# Patient Record
Sex: Female | Born: 1980
Health system: Southern US, Community
[De-identification: ages and names within clinical notes are randomized; demographics above are authoritative.]

## PROBLEM LIST (undated history)

## (undated) DIAGNOSIS — E079 Disorder of thyroid, unspecified: Secondary | ICD-10-CM

## (undated) HISTORY — DX: Disorder of thyroid, unspecified: E07.9

## (undated) HISTORY — PX: ECTOPIC PREGNANCY SURGERY: SHX613

---

## 2019-12-29 DIAGNOSIS — Z20822 Contact with and (suspected) exposure to covid-19: Secondary | ICD-10-CM | POA: Diagnosis not present

## 2020-03-01 DIAGNOSIS — H40053 Ocular hypertension, bilateral: Secondary | ICD-10-CM | POA: Diagnosis not present

## 2020-06-17 ENCOUNTER — Encounter: Payer: Self-pay | Admitting: Family Medicine

## 2020-06-17 ENCOUNTER — Ambulatory Visit
Admission: RE | Admit: 2020-06-17 | Discharge: 2020-06-17 | Disposition: A | Discharge status: Home / Self Care | Payer: BC Managed Care – PPO | Source: Ambulatory Visit | Attending: Family Medicine | Admitting: Family Medicine

## 2020-06-17 ENCOUNTER — Other Ambulatory Visit: Payer: Self-pay

## 2020-06-17 ENCOUNTER — Ambulatory Visit
Admission: RE | Admit: 2020-06-17 | Discharge: 2020-06-17 | Disposition: A | Discharge status: Home / Self Care | Payer: BC Managed Care – PPO | Attending: Family Medicine | Admitting: Family Medicine

## 2020-06-17 ENCOUNTER — Ambulatory Visit (INDEPENDENT_AMBULATORY_CARE_PROVIDER_SITE_OTHER): Payer: BC Managed Care – PPO | Admitting: Family Medicine

## 2020-06-17 VITALS — BP 138/82 | HR 72 | Ht 67.0 in | Wt 357.0 lb

## 2020-06-17 DIAGNOSIS — M25562 Pain in left knee: Secondary | ICD-10-CM

## 2020-06-17 DIAGNOSIS — Z124 Encounter for screening for malignant neoplasm of cervix: Secondary | ICD-10-CM

## 2020-06-17 DIAGNOSIS — E038 Other specified hypothyroidism: Secondary | ICD-10-CM

## 2020-06-17 DIAGNOSIS — Z7689 Persons encountering health services in other specified circumstances: Secondary | ICD-10-CM

## 2020-06-17 DIAGNOSIS — M898X6 Other specified disorders of bone, lower leg: Secondary | ICD-10-CM | POA: Diagnosis not present

## 2020-06-17 DIAGNOSIS — M1712 Unilateral primary osteoarthritis, left knee: Secondary | ICD-10-CM | POA: Diagnosis not present

## 2020-06-17 DIAGNOSIS — M25462 Effusion, left knee: Secondary | ICD-10-CM | POA: Diagnosis not present

## 2020-06-17 DIAGNOSIS — E063 Autoimmune thyroiditis: Secondary | ICD-10-CM

## 2020-06-17 MED ORDER — LEVOTHYROXINE SODIUM 150 MCG PO TABS: 150.0000 ug | ORAL_TABLET | Freq: Every day | ORAL | 1 refills | Status: DC

## 2020-06-17 MED ORDER — DICLOFENAC SODIUM 75 MG PO TBEC: 75.0000 mg | DELAYED_RELEASE_TABLET | Freq: Two times a day (BID) | ORAL | 1 refills | Status: DC | PRN

## 2020-06-17 NOTE — Progress Notes (Signed)
Date:  06/17/2020   Name:  Teresa Koch   DOB:  1980/04/09   MRN:  [id number]   Chief Complaint: Establish Care and Hypothyroidism  Patient is a 40 year old female who presents for a establish care exam. The patient reports the following problems: knee pain/ . Health maintenance has been reviewed up to date.  Knee Pain  The incident occurred more than 1 week ago (Feb 11th). The injury mechanism is unknown ("everybody was [employer]"). The pain is present in the left knee. The quality of the pain is described as aching. The pain is moderate. Pertinent negatives include no inability to bear weight, loss of motion, loss of sensation, muscle weakness, numbness or tingling.    No results found for: CREATININE, BUN, NA, K, CL, CO2 No results found for: CHOL, HDL, LDLCALC, LDLDIRECT, TRIG, CHOLHDL No results found for: TSH No results found for: HGBA1C No results found for: WBC, HGB, HCT, MCV, PLT No results found for: ALT, AST, GGT, ALKPHOS, BILITOT   Review of Systems  Constitutional: Negative.  Negative for chills, fatigue, fever and unexpected weight change.  HENT: Negative for congestion, ear discharge, ear pain, rhinorrhea, sinus pressure, sneezing and sore throat.   Eyes: Negative for photophobia, pain, discharge, redness and itching.  Respiratory: Negative for cough, shortness of breath, wheezing and stridor.   Gastrointestinal: Negative for abdominal pain, blood in stool, constipation, diarrhea, nausea and vomiting.  Endocrine: Negative for cold intolerance, heat intolerance, polydipsia, polyphagia and polyuria.  Genitourinary: Negative for dysuria, flank pain, frequency, hematuria, menstrual problem, pelvic pain, urgency, vaginal bleeding and vaginal discharge.  Musculoskeletal: Negative for arthralgias, back pain and myalgias.  Skin: Negative for rash.  Allergic/Immunologic: Negative for environmental allergies and food allergies.  Neurological: Negative for dizziness,  tingling, weakness, light-headedness, numbness and headaches.  Hematological: Negative for adenopathy. Does not bruise/bleed easily.  Psychiatric/Behavioral: Negative for dysphoric mood. The patient is not nervous/anxious.     There are no problems to display for this patient.   Allergies  Allergen Reactions  . Oxycodone-Acetaminophen     Other reaction(s): Other Hallucinations and paranoia  . Other     Other reaction(s): Other (See Comments) Percocet- Hallucinations   . Penicillins Nausea And Vomiting, Other (See Comments) and Diarrhea    Migraines      Past Surgical History:  Procedure Laterality Date  . CESAREAN SECTION     x1  . ECTOPIC PREGNANCY SURGERY      Social History   Tobacco Use  . Smoking status: Former Smoker    Years: 15.00    Types: Cigarettes    Quit date: 03/31/2015    Years since quitting: 5.2  . Smokeless tobacco: Never Used  Substance Use Topics  . Alcohol use: Yes    Comment: once a week  . Drug use: Never     Medication list has been reviewed and updated.  Current Meds  Medication Sig  . levothyroxine (SYNTHROID) 150 MCG tablet Take 1 tablet by mouth daily before breakfast.    PHQ 2/9 Scores 06/17/2020  PHQ - 2 Score 0  PHQ- 9 Score 1    GAD 7 : Generalized Anxiety Score 06/17/2020  Nervous, Anxious, on Edge 0  Control/stop worrying 0  Worry too much - different things 0  Trouble relaxing 0  Restless 0  Easily annoyed or irritable 0  Afraid - awful might happen 0  Total GAD 7 Score 0    BP Readings from Last 3 Encounters:  06/17/20 138/82    Physical Exam Vitals and nursing note reviewed.  Constitutional:      General: She is not in acute distress.    Appearance: She is not diaphoretic.  HENT:     Head: Normocephalic and atraumatic.     Nose: No congestion or rhinorrhea.  Eyes:     General:        Right eye: No discharge.        Left eye: No discharge.     Conjunctiva/sclera: Conjunctivae normal.     Pupils:  Pupils are equal, round, and reactive to light.  Neck:     Thyroid: No thyromegaly.     Vascular: No JVD.  Cardiovascular:     Rate and Rhythm: Normal rate and regular rhythm.     Heart sounds: Normal heart sounds. No murmur heard. No friction rub. No gallop.   Pulmonary:     Effort: Pulmonary effort is normal.     Breath sounds: Normal breath sounds.  Abdominal:     General: Bowel sounds are normal.     Palpations: Abdomen is soft. There is no mass.     Tenderness: There is no abdominal tenderness. There is no guarding.  Musculoskeletal:        General: Normal range of motion.     Cervical back: Normal range of motion and neck supple.     Left knee: Tenderness present over the medial joint line.  Lymphadenopathy:     Cervical: No cervical adenopathy.  Skin:    General: Skin is warm and dry.  Neurological:     Mental Status: She is alert.     Deep Tendon Reflexes: Reflexes are normal and symmetric.     Wt Readings from Last 3 Encounters:  06/17/20 (!) 357 lb (161.9 kg)    BP 138/82   Pulse 72   Ht 5\' 7"  (1.702 m)   Wt (!) 357 lb (161.9 kg)   LMP 06/02/2020 (Approximate)   BMI 55.91 kg/m   Assessment and Plan:  1. Acute pain of left knee New onset.  Persistent.  Patient was doing some activities and woke up the next morning with knee pain approximately 6 weeks ago.  Pain has continued without resolution.  We will ultimately be referring to sports medicine in the meantime we will obtain knee views and will refill diclofenac 75 mg twice a day.  We will ultimately refer to Dr. 08/02/2020 sports medicine for evaluation and treatment. - DG Knee Complete 4 Views Left; Future  2. Hypothyroidism due to Hashimoto's thyroiditis Chronic.  Controlled.  Stable.  Continue levothyroxine 150 mcg daily.  Patient will return to see me for medication review.  3. Cervical cancer screening Patient is referred to GYN for routine cervical cancer screening. - Ambulatory referral to  Gynecology  4. Establishing care with new doctor, encounter for Patient establishing care with new physician.

## 2020-06-19 ENCOUNTER — Other Ambulatory Visit: Payer: Self-pay

## 2020-06-19 DIAGNOSIS — M899 Disorder of bone, unspecified: Secondary | ICD-10-CM

## 2020-06-19 NOTE — Progress Notes (Signed)
MRI of knee placed

## 2020-06-20 ENCOUNTER — Telehealth: Payer: Self-pay

## 2020-06-20 NOTE — Telephone Encounter (Signed)
Called Lowella Bandy- it is under review by insurance- called and left pt a message

## 2020-06-20 NOTE — Telephone Encounter (Signed)
Copied from CRM [id number]. Topic: General - Other >> Jun 20, 2020  2:34 PM Tamela Oddi wrote: Reason for CRM: Patient called ot let Delice Bison know that she still has not been notified regarding her MRI.  She said Delice Bison told her to call if she hadn't been notified by mid day today.  Please call to discuss at [phone]

## 2020-06-28 ENCOUNTER — Encounter: Payer: BC Managed Care – PPO | Admitting: Obstetrics and Gynecology

## 2020-07-03 ENCOUNTER — Other Ambulatory Visit: Payer: BC Managed Care – PPO

## 2020-07-08 ENCOUNTER — Ambulatory Visit
Admission: RE | Admit: 2020-07-08 | Discharge: 2020-07-08 | Disposition: A | Discharge status: Home / Self Care | Payer: BC Managed Care – PPO | Source: Ambulatory Visit | Attending: Family Medicine | Admitting: Family Medicine

## 2020-07-08 ENCOUNTER — Other Ambulatory Visit: Payer: Self-pay

## 2020-07-08 DIAGNOSIS — S83242A Other tear of medial meniscus, current injury, left knee, initial encounter: Secondary | ICD-10-CM | POA: Diagnosis not present

## 2020-07-08 DIAGNOSIS — M899 Disorder of bone, unspecified: Secondary | ICD-10-CM | POA: Insufficient documentation

## 2020-07-08 DIAGNOSIS — M898X6 Other specified disorders of bone, lower leg: Secondary | ICD-10-CM | POA: Diagnosis not present

## 2020-07-09 ENCOUNTER — Encounter: Payer: BC Managed Care – PPO | Admitting: Obstetrics and Gynecology

## 2020-07-09 ENCOUNTER — Other Ambulatory Visit: Payer: Self-pay

## 2020-07-09 ENCOUNTER — Ambulatory Visit: Payer: BC Managed Care – PPO | Admitting: Family Medicine

## 2020-07-09 DIAGNOSIS — S83206A Unspecified tear of unspecified meniscus, current injury, right knee, initial encounter: Secondary | ICD-10-CM

## 2020-07-09 DIAGNOSIS — M949 Disorder of cartilage, unspecified: Secondary | ICD-10-CM

## 2020-07-09 NOTE — Progress Notes (Signed)
Ref to ortho placed

## 2020-07-10 ENCOUNTER — Telehealth: Payer: Self-pay

## 2020-07-10 NOTE — Telephone Encounter (Signed)
Called and spoke with patient. Asked her when her last pap was. She said she has not had one in at least 5 years. Dr Yetta Barre sent a referral for the patient last month for her to see GYN for a physical.  Told patient the importance of calling to schedule a pap and she said she will call and schedule this.

## 2020-07-11 ENCOUNTER — Ambulatory Visit (INDEPENDENT_AMBULATORY_CARE_PROVIDER_SITE_OTHER): Payer: BC Managed Care – PPO | Admitting: Family Medicine

## 2020-07-11 ENCOUNTER — Encounter: Payer: Self-pay | Admitting: Family Medicine

## 2020-07-11 ENCOUNTER — Other Ambulatory Visit: Payer: Self-pay

## 2020-07-11 VITALS — BP 120/84 | HR 80 | Ht 67.0 in | Wt 354.0 lb

## 2020-07-11 DIAGNOSIS — M899 Disorder of bone, unspecified: Secondary | ICD-10-CM | POA: Diagnosis not present

## 2020-07-11 DIAGNOSIS — M25562 Pain in left knee: Secondary | ICD-10-CM | POA: Diagnosis not present

## 2020-07-11 DIAGNOSIS — E038 Other specified hypothyroidism: Secondary | ICD-10-CM | POA: Diagnosis not present

## 2020-07-11 DIAGNOSIS — E063 Autoimmune thyroiditis: Secondary | ICD-10-CM

## 2020-07-11 NOTE — Progress Notes (Signed)
Date:  07/11/2020   Name:  Teresa Koch   DOB:  June 25, 1980   MRN:  [id number]   Chief Complaint: Hypothyroidism  Thyroid Problem Presents for follow-up visit. Patient reports no anxiety, cold intolerance, constipation, depressed mood, diaphoresis, diarrhea, dry skin, fatigue, hair loss, heat intolerance, hoarse voice, leg swelling, menstrual problem, nail problem, palpitations, tremors, visual change, weight gain or weight loss.    No results found for: CREATININE, BUN, NA, K, CL, CO2 No results found for: CHOL, HDL, LDLCALC, LDLDIRECT, TRIG, CHOLHDL No results found for: TSH No results found for: HGBA1C No results found for: WBC, HGB, HCT, MCV, PLT No results found for: ALT, AST, GGT, ALKPHOS, BILITOT   Review of Systems  Constitutional: Negative.  Negative for chills, diaphoresis, fatigue, fever, unexpected weight change, weight gain and weight loss.  HENT: Negative for congestion, ear discharge, ear pain, hoarse voice, rhinorrhea, sinus pressure, sneezing and sore throat.   Eyes: Negative for photophobia, pain, discharge, redness and itching.  Respiratory: Negative for cough, shortness of breath, wheezing and stridor.   Cardiovascular: Negative for chest pain, palpitations and leg swelling.  Gastrointestinal: Negative for abdominal pain, blood in stool, constipation, diarrhea, nausea and vomiting.  Endocrine: Negative for cold intolerance, heat intolerance, polydipsia, polyphagia and polyuria.  Genitourinary: Negative for dysuria, flank pain, frequency, hematuria, menstrual problem, pelvic pain, urgency, vaginal bleeding and vaginal discharge.  Musculoskeletal: Negative for arthralgias, back pain and myalgias.  Skin: Negative for rash.  Allergic/Immunologic: Negative for environmental allergies and food allergies.  Neurological: Negative for dizziness, tremors, weakness, light-headedness, numbness and headaches.  Hematological: Negative for adenopathy. Does not bruise/bleed  easily.  Psychiatric/Behavioral: Negative for dysphoric mood. The patient is not nervous/anxious.     There are no problems to display for this patient.   Allergies  Allergen Reactions  . Oxycodone-Acetaminophen     Other reaction(s): Other Hallucinations and paranoia  . Other     Other reaction(s): Other (See Comments) Percocet- Hallucinations   . Penicillins Nausea And Vomiting, Other (See Comments) and Diarrhea    Migraines      Past Surgical History:  Procedure Laterality Date  . CESAREAN SECTION     x1  . ECTOPIC PREGNANCY SURGERY      Social History   Tobacco Use  . Smoking status: Former Smoker    Years: 15.00    Types: Cigarettes    Quit date: 03/31/2015    Years since quitting: 5.2  . Smokeless tobacco: Never Used  Substance Use Topics  . Alcohol use: Yes    Comment: once a week  . Drug use: Never     Medication list has been reviewed and updated.  Current Meds  Medication Sig  . diclofenac (VOLTAREN) 75 MG EC tablet Take 1 tablet (75 mg total) by mouth 2 (two) times daily as needed.  Marland Kitchen levothyroxine (SYNTHROID) 150 MCG tablet Take 1 tablet (150 mcg total) by mouth daily before breakfast.    PHQ 2/9 Scores 06/17/2020  PHQ - 2 Score 0  PHQ- 9 Score 1    GAD 7 : Generalized Anxiety Score 06/17/2020  Nervous, Anxious, on Edge 0  Control/stop worrying 0  Worry too much - different things 0  Trouble relaxing 0  Restless 0  Easily annoyed or irritable 0  Afraid - awful might happen 0  Total GAD 7 Score 0    BP Readings from Last 3 Encounters:  07/11/20 120/84  06/17/20 138/82    Physical Exam Vitals and nursing  note reviewed.  Constitutional:      Appearance: She is well-developed.  HENT:     Head: Normocephalic.     Right Ear: Tympanic membrane, ear canal and external ear normal.     Left Ear: Tympanic membrane, ear canal and external ear normal.     Nose: Nose normal. No congestion or rhinorrhea.     Mouth/Throat:     Mouth: Mucous  membranes are moist.  Eyes:     General: Lids are everted, no foreign bodies appreciated. No scleral icterus.       Left eye: No foreign body or hordeolum.     Conjunctiva/sclera: Conjunctivae normal.     Right eye: Right conjunctiva is not injected.     Left eye: Left conjunctiva is not injected.     Pupils: Pupils are equal, round, and reactive to light.  Neck:     Thyroid: No thyroid mass, thyromegaly or thyroid tenderness.     Vascular: No carotid bruit or JVD.     Trachea: No tracheal deviation.  Cardiovascular:     Rate and Rhythm: Normal rate and regular rhythm.     Heart sounds: Normal heart sounds. No murmur heard. No friction rub. No gallop.   Pulmonary:     Effort: Pulmonary effort is normal. No respiratory distress.     Breath sounds: Normal breath sounds. No wheezing, rhonchi or rales.  Abdominal:     General: Bowel sounds are normal.     Palpations: Abdomen is soft. There is no hepatomegaly, splenomegaly or mass.     Tenderness: There is no abdominal tenderness. There is no guarding or rebound.  Musculoskeletal:        General: No tenderness. Normal range of motion.     Cervical back: Normal range of motion and neck supple. No rigidity or tenderness.  Lymphadenopathy:     Cervical: No cervical adenopathy.     Right cervical: No superficial or deep cervical adenopathy.    Left cervical: No superficial or deep cervical adenopathy.  Skin:    General: Skin is warm.     Findings: No erythema or rash.  Neurological:     Mental Status: She is alert and oriented to person, place, and time.     Cranial Nerves: No cranial nerve deficit.     Deep Tendon Reflexes: Reflexes normal.  Psychiatric:        Mood and Affect: Mood is not anxious or depressed.     Wt Readings from Last 3 Encounters:  07/11/20 (!) 354 lb (160.6 kg)  06/17/20 (!) 357 lb (161.9 kg)    BP 120/84   Pulse 80   Ht 5\' 7"  (1.702 m)   Wt (!) 354 lb (160.6 kg)   BMI 55.44 kg/m   Assessment and  Plan: 1. Hypothyroidism due to Hashimoto's thyroiditis Chronic.  Controlled.  Stable.  Patient is currently on levothyroxine 150 mcg daily.  We will check a TSH with panel to assess current level of control. - Thyroid Panel With TSH  2. Acute pain of left knee New onset.  Patient is currently on an anti-inflammatory and is improving.  There is a tear that is noted in a portion of the cartilage this is seemingly starting to resolve to the point of patient's acceptance of the level of pain.  This is been addressed with patient and her next step is to have orthopedics involved for possible involvement in the near future.  3. Lesion of bone of knee There is  an incidental finding of a subchondral lesion on MRI.  This will need to be further evaluated either by direct MRI of the area or orthopedic evaluation.  Patient is currently in going to [place]. [place] we are in the process of delineating which direction of further evaluation at this time.  Patient has been informed that if he has not been determined the definite direction of evaluation in 6 weeks that she is still notify us or return for reevaluation.

## 2020-07-11 NOTE — Addendum Note (Signed)
Addended by: Everitt Amber on: 07/11/2020 11:32 AM   Modules accepted: Orders

## 2020-07-12 LAB — THYROID PANEL WITH TSH
Free Thyroxine Index: 2.3 (ref 1.2–4.9)
T3 Uptake Ratio: 29 % (ref 24–39)
T4, Total: 7.9 ug/dL (ref 4.5–12.0)
TSH: 11.1 u[IU]/mL — ABNORMAL HIGH (ref 0.450–4.500)

## 2020-07-15 ENCOUNTER — Other Ambulatory Visit: Payer: Self-pay

## 2020-07-15 DIAGNOSIS — E038 Other specified hypothyroidism: Secondary | ICD-10-CM

## 2020-07-15 DIAGNOSIS — E063 Autoimmune thyroiditis: Secondary | ICD-10-CM

## 2020-07-15 MED ORDER — LEVOTHYROXINE SODIUM 175 MCG PO TABS: 175.0000 ug | ORAL_TABLET | Freq: Every day | ORAL | 1 refills | Status: DC

## 2020-07-24 DIAGNOSIS — M1712 Unilateral primary osteoarthritis, left knee: Secondary | ICD-10-CM | POA: Diagnosis not present

## 2020-07-24 DIAGNOSIS — M899 Disorder of bone, unspecified: Secondary | ICD-10-CM | POA: Diagnosis not present

## 2020-07-24 DIAGNOSIS — M23204 Derangement of unspecified medial meniscus due to old tear or injury, left knee: Secondary | ICD-10-CM | POA: Diagnosis not present

## 2020-07-24 DIAGNOSIS — M25562 Pain in left knee: Secondary | ICD-10-CM | POA: Diagnosis not present

## 2020-07-25 ENCOUNTER — Encounter: Payer: BC Managed Care – PPO | Admitting: Family Medicine

## 2020-07-28 ENCOUNTER — Ambulatory Visit: Payer: BC Managed Care – PPO

## 2020-08-01 ENCOUNTER — Other Ambulatory Visit: Payer: Self-pay

## 2020-08-01 ENCOUNTER — Ambulatory Visit
Admission: RE | Admit: 2020-08-01 | Discharge: 2020-08-01 | Disposition: A | Discharge status: Home / Self Care | Payer: BC Managed Care – PPO | Source: Ambulatory Visit | Attending: Family Medicine | Admitting: Family Medicine

## 2020-08-01 ENCOUNTER — Ambulatory Visit: Payer: BC Managed Care – PPO

## 2020-08-01 DIAGNOSIS — R600 Localized edema: Secondary | ICD-10-CM | POA: Diagnosis not present

## 2020-08-01 DIAGNOSIS — M899 Disorder of bone, unspecified: Secondary | ICD-10-CM | POA: Insufficient documentation

## 2020-08-01 MED ORDER — GADOBUTROL 1 MMOL/ML IV SOLN
10.0000 mL | Freq: Once | INTRAVENOUS | Status: AC | PRN
  Administered 2020-08-01: 10 mL via INTRAVENOUS

## 2020-08-20 ENCOUNTER — Other Ambulatory Visit: Payer: Self-pay | Admitting: Family Medicine

## 2020-08-20 DIAGNOSIS — M25562 Pain in left knee: Secondary | ICD-10-CM

## 2020-08-20 NOTE — Telephone Encounter (Signed)
Requested Prescriptions  Pending Prescriptions Disp Refills  . diclofenac (VOLTAREN) 75 MG EC tablet [Pharmacy Med Name: DICLOFENAC SOD EC 75 MG TAB] 60 tablet 1    Sig: TAKE 1 TABLET BY MOUTH 2 TIMES DAILY AS NEEDED.     Analgesics:  NSAIDS Failed - 08/20/2020  1:41 AM      Failed - Cr in normal range and within 360 days    No results found for: CREATININE, LABCREAU, LABCREA, POCCRE       Failed - HGB in normal range and within 360 days    No results found for: HGB, HGBKUC, HGBPOCKUC, HGBOTHER, TOTHGB, HGBPLASMA       Passed - Patient is not pregnant      Passed - Valid encounter within last 12 months    Recent Outpatient Visits          1 month ago Hypothyroidism due to Hashimoto's thyroiditis   [facility] Duanne Limerick, MD   2 months ago Acute pain of left knee   [facility] Duanne Limerick, MD

## 2020-09-02 ENCOUNTER — Other Ambulatory Visit: Payer: Self-pay | Admitting: Family Medicine

## 2020-09-02 DIAGNOSIS — E038 Other specified hypothyroidism: Secondary | ICD-10-CM

## 2020-10-21 ENCOUNTER — Encounter: Payer: BC Managed Care – PPO | Admitting: Family Medicine

## 2020-10-29 ENCOUNTER — Ambulatory Visit: Payer: BC Managed Care – PPO

## 2020-10-29 ENCOUNTER — Other Ambulatory Visit: Payer: Self-pay

## 2020-10-29 ENCOUNTER — Encounter: Payer: Self-pay | Admitting: Family Medicine

## 2020-10-29 ENCOUNTER — Ambulatory Visit (INDEPENDENT_AMBULATORY_CARE_PROVIDER_SITE_OTHER): Payer: BC Managed Care – PPO | Admitting: Family Medicine

## 2020-10-29 VITALS — BP 122/84 | HR 93 | Temp 98.5°F | Ht 67.0 in | Wt 351.0 lb

## 2020-10-29 DIAGNOSIS — M1712 Unilateral primary osteoarthritis, left knee: Secondary | ICD-10-CM | POA: Diagnosis not present

## 2020-10-29 DIAGNOSIS — M899 Disorder of bone, unspecified: Secondary | ICD-10-CM | POA: Diagnosis not present

## 2020-10-29 DIAGNOSIS — S83241A Other tear of medial meniscus, current injury, right knee, initial encounter: Secondary | ICD-10-CM | POA: Insufficient documentation

## 2020-10-29 DIAGNOSIS — E063 Autoimmune thyroiditis: Secondary | ICD-10-CM

## 2020-10-29 DIAGNOSIS — E038 Other specified hypothyroidism: Secondary | ICD-10-CM

## 2020-10-29 DIAGNOSIS — S83241D Other tear of medial meniscus, current injury, right knee, subsequent encounter: Secondary | ICD-10-CM

## 2020-10-29 NOTE — Assessment & Plan Note (Signed)
Patient without mechanical symptoms, minimally symptomatic on provocative testing, medial meniscus involvement given the extent of osteoarthritis as expected, can consider surgical options if patient fails to adequately progress.

## 2020-10-29 NOTE — Assessment & Plan Note (Signed)
Patient with moderate to severe osteoarthritis related symptoms focal to the patellofemoral and medial tibiofemoral articulations.  Incidentally noted bone lesion at the proximal fibula on her MRI, plan to follow-up imaging scheduled.  Her physical exam findings are consistent with her imaging.  I reviewed nonsurgical strategies for management and we will reach out to obtain authorization for viscosupplementation, additional given the effusion noted today, we will coordinate a time for her to return for possible aspiration and anticipated corticosteroid injection.  Over the interim she is to continue diclofenac on an as-needed basis, maintain gentle activity, and a referral was placed for the start of physical therapy.

## 2020-10-29 NOTE — Assessment & Plan Note (Signed)
Proximal fibula bone lesion incidentally noted on MRI of the left knee, differential per radiologist includes enchondroma versus low-grade chondrosarcoma.  Per chart review, orthopedic oncologist has reviewed images and has recommended repeat imaging in 6-12 months.  This has been ordered by the patient's primary care provider Dr. Yetta Barre, pending results, further formal evaluation to be considered by orthopedic oncologist.

## 2020-10-29 NOTE — Progress Notes (Signed)
Primary Care / Sports Medicine Office Visit  Patient Information:  Patient ID: Teresa Koch, female DOB: 07-25-1980 Age: 40 y.o. MRN: [id number]   Teresa Koch is a pleasant 40 y.o. female presenting with the following:  Chief Complaint  Patient presents with   Knee Pain    Left Knee. This all started in February. Seen Dr Yetta Barre in March and was put on Nsaids. Patient has torn meniscus of right knee per MRI.     Review of Systems pertinent details above   Patient Active Problem List   Diagnosis Date Noted   Primary osteoarthritis of left knee 10/29/2020   Tear of medial meniscus of right knee, current 10/29/2020   Lesion of bone of knee 10/29/2020   Past Medical History:  Diagnosis Date   Thyroid disease    [email or web address]   Outpatient Encounter Medications as of 10/29/2020  Medication Sig   diclofenac (VOLTAREN) 75 MG EC tablet TAKE 1 TABLET BY MOUTH 2 TIMES DAILY AS NEEDED.   levothyroxine (SYNTHROID) 175 MCG tablet TAKE 1 TABLET BY MOUTH DAILY BEFORE BREAKFAST.   No facility-administered encounter medications on file as of 10/29/2020.   Past Surgical History:  Procedure Laterality Date   CESAREAN SECTION     x1   ECTOPIC PREGNANCY SURGERY      Vitals:   10/29/20 0930  BP: 122/84  Pulse: 93  Temp: 98.5 F (36.9 C)  SpO2: 95%   Vitals:   10/29/20 0930  Weight: (!) 351 lb (159.2 kg)  Height: 5\' 7"  (1.702 m)   Body mass index is 54.97 kg/m.  No results found.   Independent interpretation of notes and tests performed by another provider:   Independent interpretation of left knee x-ray dated 06/17/2020 reveals moderate to severe medial tibiofemoral osteoarthritis, subtle angulation of the proximal fibula with sclerosis, patellofemoral tilt and lateral osteophyte formation and narrowing noted, no acute osseous process identified  Procedures performed:   None  Pertinent History, Exam, Impression, and Recommendations:   Primary osteoarthritis of left  knee Patient with moderate to severe osteoarthritis related symptoms focal to the patellofemoral and medial tibiofemoral articulations.  Incidentally noted bone lesion at the proximal fibula on her MRI, plan to follow-up imaging scheduled.  Her physical exam findings are consistent with her imaging.  I reviewed nonsurgical strategies for management and we will reach out to obtain authorization for viscosupplementation, additional given the effusion noted today, we will coordinate a time for her to return for possible aspiration and anticipated corticosteroid injection.  Over the interim she is to continue diclofenac on an as-needed basis, maintain gentle activity, and a referral was placed for the start of physical therapy.  Tear of medial meniscus of right knee, current Patient without mechanical symptoms, minimally symptomatic on provocative testing, medial meniscus involvement given the extent of osteoarthritis as expected, can consider surgical options if patient fails to adequately progress.  Lesion of bone of knee Proximal fibula bone lesion incidentally noted on MRI of the left knee, differential per radiologist includes enchondroma versus low-grade chondrosarcoma.  Per chart review, orthopedic oncologist has reviewed images and has recommended repeat imaging in 6-12 months.  This has been ordered by the patient's primary care provider Dr. 8-12, pending results, further formal evaluation to be considered by orthopedic oncologist.    Orders & Medications No orders of the defined types were placed in this encounter.  Orders Placed This Encounter  Procedures   Ambulatory referral to Physical Therapy  Return We will contact for scheduling.     Jerrol Banana, MD   Primary Care Sports Medicine [facility]

## 2020-10-29 NOTE — Patient Instructions (Signed)
-   Continue diclofenac on an as-needed basis - Utilize ice at 20-minute intervals for additional pain control - Start physical therapy, referral coordinator will contact you in regards to scheduling - Our office will contact you for follow-up after we reach out for viscosupplementation "gel injection" - Continue with activity as tolerated using knee symptoms as a guide - Contact our office for any questions between now and follow-up

## 2020-10-30 ENCOUNTER — Telehealth: Payer: Self-pay | Admitting: Family Medicine

## 2020-10-30 ENCOUNTER — Other Ambulatory Visit: Payer: Self-pay

## 2020-10-30 DIAGNOSIS — E038 Other specified hypothyroidism: Secondary | ICD-10-CM

## 2020-10-30 LAB — THYROID PANEL WITH TSH
Free Thyroxine Index: 3.2 (ref 1.2–4.9)
T3 Uptake Ratio: 30 % (ref 24–39)
T4, Total: 10.5 ug/dL (ref 4.5–12.0)
TSH: 1.91 u[IU]/mL (ref 0.450–4.500)

## 2020-10-30 MED ORDER — LEVOTHYROXINE SODIUM 175 MCG PO TABS: 175.0000 ug | ORAL_TABLET | Freq: Every day | ORAL | 0 refills | Status: DC

## 2020-10-30 NOTE — Telephone Encounter (Signed)
Sent in #[phone] with 0 Refills.

## 2020-10-30 NOTE — Telephone Encounter (Signed)
Pt returned Taras call and also wanted to have her thyroid medication sent into pharmacy as a 90 day supply due to insurance not covering 30 / please advise

## 2020-10-31 ENCOUNTER — Other Ambulatory Visit: Payer: Self-pay | Admitting: Family Medicine

## 2020-10-31 DIAGNOSIS — M25562 Pain in left knee: Secondary | ICD-10-CM

## 2020-11-05 NOTE — Progress Notes (Signed)
New case submitted for left knee viscosupplementation through the [employer] portal. Pending response.

## 2020-11-07 ENCOUNTER — Other Ambulatory Visit: Payer: Self-pay

## 2020-11-07 ENCOUNTER — Ambulatory Visit: Payer: BC Managed Care – PPO | Attending: Family Medicine | Admitting: Physical Therapy

## 2020-11-07 ENCOUNTER — Encounter: Payer: Self-pay | Admitting: Physical Therapy

## 2020-11-07 DIAGNOSIS — R2689 Other abnormalities of gait and mobility: Secondary | ICD-10-CM | POA: Diagnosis not present

## 2020-11-07 DIAGNOSIS — M25562 Pain in left knee: Secondary | ICD-10-CM | POA: Insufficient documentation

## 2020-11-07 DIAGNOSIS — M25662 Stiffness of left knee, not elsewhere classified: Secondary | ICD-10-CM | POA: Insufficient documentation

## 2020-11-07 DIAGNOSIS — R2681 Unsteadiness on feet: Secondary | ICD-10-CM | POA: Diagnosis not present

## 2020-11-07 DIAGNOSIS — G8929 Other chronic pain: Secondary | ICD-10-CM | POA: Insufficient documentation

## 2020-11-07 NOTE — Therapy (Signed)
[facility] [address] [place], [place], [address] Phone: [phone]   Fax:  [phone]  Physical Therapy Treatment and Initial Eval 11/07/20  Patient Details  Name: Teresa FettersRyann Tardiff MRN: [id number] Date of Birth: 1980/12/29 Referring Provider (PT): Joseph BerkshireJason Matthews MD  Encounter Date: 11/07/2020   PT End of Session - 11/07/20 1421     Visit Number 1    Number of Visits 12    Date for PT Re-Evaluation 12/19/20    Authorization - Visit Number 1    Authorization - Number of Visits 10    Progress Note Due on Visit 10    PT Start Time 1259    PT Stop Time 1348    PT Time Calculation (min) 49 min    Activity Tolerance Patient tolerated treatment well;No increased pain    Behavior During Therapy WFL for tasks assessed/performed             Past Medical History:  Diagnosis Date   Thyroid disease    hashimotos    Past Surgical History:  Procedure Laterality Date   CESAREAN SECTION     x1   ECTOPIC PREGNANCY SURGERY      There were no vitals filed for this visit.   Subjective Assessment - 11/07/20 1400     Subjective Pt reported the start of the pain was in the morning after playing with her kids the night before. Onset of pain was in mid February 2022. Pt had a virtual visit shortly after where they suggested diclofenac. Pt later got a x-ray and MRI displaying L knee OA, medial mensicus tear, and abnormal bone density of the fibula. Pt states increased difficulty walking down stairs compared to ascending.  Pt stated that her knee pain has improved from initial onset all but still is not 100%.    Pertinent History x-ray dated 06/17/2020 reveals moderate to severe medial tibiofemoral osteoarthritis, subtle angulation of the proximal fibula. MRI 08/02/2020: Medial meniscus of L knee, Lesion of L Promixal fibula, L knee OA. Pt works from home and is required to travel often for her job 2x months. Her kids are into sports and enjoys  going to the games and shopping. Currently not physical activity.    Limitations Sitting;House hold activities    How long can you sit comfortably? 2 hours    How long can you stand comfortably? WFL    How long can you walk comfortably? WFL    Diagnostic tests x-ray and MRI: OA, Subtle angulation of proximal medial meniscus tear of L knee    Patient Stated Goals Management of pain, and amb up and down stairs in the home without pain and UE assist.    Currently in Pain? Yes    Pain Score 2     Pain Location Knee    Pain Orientation Left    Pain Descriptors / Indicators Aching    Pain Type Chronic pain    Pain Radiating Towards n/a    Pain Onset More than a month ago    Pain Frequency Occasional    Aggravating Factors  hurts more at rest then when movement. Laying down is the most painful position. First few step after prolonged sitting bouts.    Pain Relieving Factors Movement decrease pain, CBD cream, occ Advil    Effect of Pain on Daily Activities Stairs in the home, prolong sitting during work related tasks    Multiple Pain Sites No  SUBJECTIVE Chief complaint:  Pt reported the start of the pain was in the morning after playing with her kids the night before. Onset of pain was in mid February 2022. Pt had a virtual visit shortly after where they suggestion diclofenac. Pain later got a x-ray and MRI displaying L knee OA, medial mensicus tear, and abnormal bone density of the fibular. Pt states increased difficulty walking down stairs compared to up. Pt stated that her knee pain as improved from initial onset all but still is not 100%.  History: x-ray dated 06/17/2020 reveals moderate to severe medial tibiofemoral osteoarthritis, subtle angulation of the proximal fibula. MRI 08/02/2020: Medial meniscus of L knee, Lesion of L Promixal fibula, L knee OA. Pt works from home and is required to travel often for her job 2x months. Her kids are into sports and enjoys going to the  games and shopping. Currently not physical activity. Referring Dx: Primary OA of L knee with medial meniscus tear Referring Provider: Joseph Berkshire MD  Pain location: Medial knee with fatigue at lateral lower leg.  Pain: Present 2/10, Best 0/10, Worst 5/10: Pain quality: Twingy when mild, ache,  Radiating pain: N/A Numbness/Tingling: no  24 hour pain behavior: Increase pain in the morning with first few steps. Mid day: pt has mild pain and is able to walk up and down stairs. Evening the ache returns.  Aggravating factors: hurts more at rest then when movement. Laying down is the most painful position. First few step after prolonged sitting bouts.  Easing factors:Movement decrease pain, CBD cream, occ Advil  How long can you sit: 2 hours How long can you stand: WFL How long can you walk: [facility] History of back injury, pain, surgery, or therapy: None Follow-up appointment with MD: Waiting for a call back.  Dominant hand: Left Imaging*per referring note*: X-ray: Independent interpretation of left knee x-ray dated 06/17/2020 reveals moderate to severe medial tibiofemoral osteoarthritis, subtle angulation of the proximal fibula with sclerosis, patellofemoral tilt and lateral osteophyte formation and narrowing noted, no acute osseous process identified MRI Tear of medial meniscus of left  knee, current Patient without mechanical symptoms, minimally symptomatic on provocative testing, medial meniscus involvement given the extent of osteoarthritis as expected, can consider surgical options if patient fails to adequately progress.   Lesion of bone of knee Proximal fibula bone lesion incidentally noted on MRI of the left knee, differential per radiologist includes enchondroma versus low-grade chondrosarcoma.  Per chart review, orthopedic oncologist has reviewed images and has recommended repeat imaging in 6-12 months.  This has been ordered by the patient's primary care provider Dr. Yetta Barre, pending results,  further formal evaluation to be considered by orthopedic oncologist. Falls in the last 6 months:  Occupational demands: Pt works from home and is required to travel often for her job 2x months. Hobbies: Her kids are into sports and enjoys going to the games and shopping.  Goals: Management of pain, and amb up and down stairs in the home without pain and UE assist.  Red flags (bowel/bladder changes, saddle paresthesia, personal history of cancer, chills/fever, night sweats, unrelenting pain, first onset of insidious LBP <20 y/o) Negative    OBJECTIVE  FOTO: Score 52 with predicted improvement value of 69  Mental Status Patient is oriented to person, place and time.  Recent memory is intact.  Remote memory is intact.  Attention span and concentration are intact.  Expressive speech is intact.  Patient's level of knowledge is within normal limits for educational level.  SENSATION: Grossly intact to light touch bilateral LEs as determined by testing dermatomes L2-S2    MUSCULOSKELETAL: Tremor: None Bulk: Normal Tone: Normal   Posture Seated standing posture with WNL with slight weight to the RLE. Able to correct with verbal cueing.   Gait Mild antalgic gait with LLE involvement at the start of tx. Antalgic gait was eliminated after LE screen.  Stairs: bilat ER, reduced knee flexion during eccentric loading, requires UE assist.    Palpation -TTP medial L knee joint line and 3 cm below, L patella tendon, popliteal fossa.  -R knee no tenderness.   Strength (out of 5) R/L 5/5 Hip flexion 5/5 Hip abduction 5/5 Hip adduction 5/5 Knee extension 5/5 Knee flexion 5/5 Ankle dorsiflexion   *Indicates pain   AROM (degrees) Hip Flexion (0-125): WNL Hip Abduction (0-40): deferred  Knee Flexion (0-135): R:100 L:100 Knee Extension (0): R:0 L: 5 Ankle DF (0-35) WFL Ankle PL (0-50) WFL   PROM (degrees)  Knee Flexion (0-135): R: same as AROM L: same as AROM Knee Extension (0):  R:Same as AROM L: Same as AROM- limited by pain.  Ankle DF (0-35) same as AROM Ankle PL (0-50) same as AROM  Repeated Movements No warranted at initial eval    Muscle Length Hamstrings: R: WNL degrees L: lacking 20 degrees  Thomas: deferred to later tx 2/2 time       SPECIAL TESTS  Hip: FABER (SN 81): NEG bilat  FADIR (SN 94): NEG bilat  Hip scour (SN 50): NEG bilat   Knee:  Clarke's test: postive L neg R  Steinman test Neg bilat  Ant draw Neg bilat  Post Draw Neg bilat  Valgus stressNeg bilat  Varus stressNeg bilat     Functional Tasks   Squatting: Partial squat to post hip touch on table x10. Pt displayed minor weight shift to R. Able to correct with verbal cueing. No increase in pain  Sit to stand: WNL  Single leg stance: R:13 sec L: 9 sec. Pt display more difficulty with SLS on left compared to right with more aberrant movement.     There.ex.:  See HEP ([id number])     [facility] PT Assessment - 11/07/20 0001       Assessment   Medical Diagnosis Primary OA of L knee with medial meniscus tear    Referring Provider (PT) Joseph Berkshire MD    Onset Date/Surgical Date 05/15/20    Hand Dominance Left    Next MD Visit Waiting for call back    Prior Therapy No      Precautions   Precautions None      Restrictions   Weight Bearing Restrictions No      Balance Screen   Has the patient fallen in the past 6 months No      Home Environment   Living Environment Private residence      Prior Function   Level of Independence Independent    Vocation Full time employment    Vocation Requirements works at home with 2x month travel for work    Leisure watching kids play sports, shoping, reading.                  PT Education - 11/07/20 1420     Education Details Pt was educate on proper form and technique for HEP for intial adherence.    Person(s) Educated Patient    Methods Explanation;Handout;Demonstration;Verbal cues    Comprehension Verbalized  understanding  PT Long Term Goals - 11/07/20 1442       PT LONG TERM GOAL #1   Title Pt will decrease L knee pain as reported on NPRS by at least 2 points in order to demonstrate clinically significant reduction in back pain.    Baseline 5/10 at worst    Time 6    Period Weeks    Status New    Target Date 12/19/20      PT LONG TERM GOAL #2   Title Pt will improve foto score to 69 to display reported return to optimal funtion and independence with home and community related ADLs    Baseline 52/56 on initial evaluation    Time 6    Period Weeks    Status New    Target Date 12/19/20      PT LONG TERM GOAL #3   Title Pt will improve L knee ext ROM in supine and hamstring mobilty equal to R side with pain less than 2/10 to promote a normalized gait pattern.    Baseline supine ROM lacking 5 deg. L hamstring deficits lacking 20 deg compared to R    Time 6    Period Weeks    Status New    Target Date 12/19/20      PT LONG TERM GOAL #4   Title Pt will improve SLS to 30 second bilat without increase in pain and UE assit to promote greater bilat knee stability for decreased risk of further injury.    Baseline R:13 sec L: 9 sec. Pt display more difficulty with SLS on left compared to right with more aberrant movement.    Time 6    Period Weeks    Status New    Target Date 12/19/20                   Plan - 11/07/20 1422     Clinical Impression Statement Pt is a pleasant 40 year-old female referred for primary L knee OA with medial meniscus involvement. PT examination reveals the following deficits: FOTO score of 52 with predicted improvement of 69. Worse pain of 5/10. L knee ext(lacking 5) and hamstring mobility (lacking 20) deficits compared to the other side. Poor bilat SLS balance(13 sec R, 9 Sec L). Pt displayed 5/5 strength with bilat seated muscle testing. Pt's case displays a moderate complexity due to time from onset, BMI, current physical activity  status. Pt will benefit from skilled PT 2x weeks for 6 weeks with a good prognosis for return to PLOF.    Personal Factors and Comorbidities Time since onset of injury/illness/exacerbation;Fitness;Profession    Examination-Activity Limitations Sit;Sleep;Bend;Lift;Squat;Stairs    Examination-Participation Restrictions Occupation;Shop    Stability/Clinical Decision Making Evolving/Moderate complexity    Clinical Decision Making Moderate    Rehab Potential Good    PT Frequency 2x / week    PT Duration 6 weeks    PT Treatment/Interventions ADLs/Self Care Home Management;Aquatic Therapy;Biofeedback;Cryotherapy;Electrical Stimulation;Moist Heat;Traction;Gait training;Stair training;Functional mobility training;Therapeutic activities;Neuromuscular re-education;Balance training;Therapeutic exercise;Patient/family education;Manual techniques;Passive range of motion;Dry needling;Spinal Manipulations;Joint Manipulations;Taping    PT Next Visit Plan Reassess soreness and HEP adherence.    PT Home Exercise Plan [email or web address]    Recommended Other Services Not at this time.             Patient will benefit from skilled therapeutic intervention in order to improve the following deficits and impairments:  Abnormal gait, Decreased activity tolerance, Decreased balance, Decreased endurance, Decreased mobility, Decreased range of motion, Difficulty walking,  Hypomobility, Increased edema, Impaired flexibility, Pain, Improper body mechanics, Obesity  Visit Diagnosis: Chronic pain of left knee  Generally unsteady  Antalgic gait  Decreased ROM of left knee     Problem List Patient Active Problem List   Diagnosis Date Noted   Primary osteoarthritis of left knee 10/29/2020   Tear of medial meniscus of right knee, current 10/29/2020   Lesion of bone of knee 10/29/2020   Cammie Mcgee, PT, DPT # [id number] Lawernce Ion SPT 11/07/2020, 6:39 PM  [facility] [address] [place], [place], [address] Phone: [phone]   Fax:  [phone]  Name: Lincoln Kleiner MRN: [id number] Date of Birth: [birth year]

## 2020-11-12 ENCOUNTER — Other Ambulatory Visit: Payer: Self-pay

## 2020-11-12 ENCOUNTER — Ambulatory Visit: Payer: BC Managed Care – PPO | Admitting: Physical Therapy

## 2020-11-12 ENCOUNTER — Encounter: Payer: Self-pay | Admitting: Physical Therapy

## 2020-11-12 DIAGNOSIS — R2689 Other abnormalities of gait and mobility: Secondary | ICD-10-CM

## 2020-11-12 DIAGNOSIS — M25662 Stiffness of left knee, not elsewhere classified: Secondary | ICD-10-CM | POA: Diagnosis not present

## 2020-11-12 DIAGNOSIS — R2681 Unsteadiness on feet: Secondary | ICD-10-CM

## 2020-11-12 DIAGNOSIS — M25562 Pain in left knee: Secondary | ICD-10-CM | POA: Diagnosis not present

## 2020-11-12 DIAGNOSIS — G8929 Other chronic pain: Secondary | ICD-10-CM | POA: Diagnosis not present

## 2020-11-12 NOTE — Therapy (Signed)
[facility] [address] [place], [place], [address] Phone: [phone]   Fax:  [phone]  Physical Therapy Treatment  Patient Details  Name: Teresa Koch MRN: [id number] Date of Birth: 1980-12-30 Referring Provider (PT): Joseph Berkshire MD   Encounter Date: 11/12/2020   PT End of Session - 11/12/20 1307     Visit Number 2    Number of Visits 12    Date for PT Re-Evaluation 12/19/20    Authorization - Visit Number 2    Authorization - Number of Visits 10    Progress Note Due on Visit 10    PT Start Time 1304    PT Stop Time 1349    PT Time Calculation (min) 45 min    Activity Tolerance Patient tolerated treatment well;No increased pain    Behavior During Therapy WFL for tasks assessed/performed             Past Medical History:  Diagnosis Date   Thyroid disease    hashimotos    Past Surgical History:  Procedure Laterality Date   CESAREAN SECTION     x1   ECTOPIC PREGNANCY SURGERY      There were no vitals filed for this visit.   Subjective Assessment - 11/12/20 1303     Subjective Pt present to tx with 1-2/10 L knee pain. Pt states good adherence to HEP without adverse soreness. Pt stated no increase in knee pain over the weekend.    Pertinent History x-ray dated 06/17/2020 reveals moderate to severe medial tibiofemoral osteoarthritis, subtle angulation of the proximal fibula. MRI 08/02/2020: Medial meniscus of L knee, Lesion of L Promixal fibula, L knee OA. Pt works from home and is required to travel often for her job 2x months. Her kids are into sports and enjoys going to the games and shopping. Currently not physical activity.    Limitations Sitting;House hold activities    How long can you sit comfortably? 2 hours    How long can you stand comfortably? WFL    How long can you walk comfortably? WFL    Diagnostic tests x-ray and MRI: OA, Subtle angulation of proximal medial meniscus tear of L knee    Patient  Stated Goals Management of pain, and amb up and down stairs in the home without pain and UE assist.    Currently in Pain? Yes    Pain Score 2     Pain Location Knee    Pain Orientation Left    Pain Descriptors / Indicators Aching    Pain Type Chronic pain    Pain Onset More than a month ago              Treatment   Therapeutic Exercise:  NuStep on position 11, level 4 without UE assist. Pt reported no increase knee pain  // Bars with touch assist and occ verbal cueing to proper form and function.  1) standing abd x30 each direction  2) partial walking lunges x3 laps  3) Walking hamstring curls x 3 laps    Stair climbing x3 with bilat UE assist, 3x with only handrail on L, and x3 without UE assist. Pt reports more pain during decent compared to ascent.     Airex stability with 1 min in each position with verbal cueing for core activation. 1) standard stance width  2) feet together  3)tandem x2  4) SLS x2   Pt required occ touch assist during tandem and SLS for  both LE.    STS without UE from plinth 3x10 with lowering of the table after each set with the 3rd set on the lowest level. Verbal cueing for proper LE alignment and loading.    Manual Therapy:  IASTM with hypervolt to distal L quad and light pressure over patella tendon and medial knee. X11 mins     PT Education - 11/12/20 1306     Education Details Pt was educate on proper form and tecnique during therex.    Person(s) Educated Patient    Methods Explanation;Demonstration;Verbal cues    Comprehension Verbalized understanding;Need further instruction                 PT Long Term Goals - 11/07/20 1442       PT LONG TERM GOAL #1   Title Pt will decrease L knee pain as reported on NPRS by at least 2 points in order to demonstrate clinically significant reduction in back pain.    Baseline 5/10 at worst    Time 6    Period Weeks    Status New    Target Date 12/19/20      PT LONG TERM GOAL #2    Title Pt will improve foto score to 69 to display reported return to optimal funtion and independence with home and community related ADLs    Baseline 52/56 on initial evaluation    Time 6    Period Weeks    Status New    Target Date 12/19/20      PT LONG TERM GOAL #3   Title Pt will improve L knee ext ROM in supine and hamstring mobilty equal to R side with pain less than 2/10 to promote a normalized gait pattern.    Baseline supine ROM lacking 5 deg. L hamstring deficits lacking 20 deg compared to R    Time 6    Period Weeks    Status New    Target Date 12/19/20      PT LONG TERM GOAL #4   Title Pt will improve SLS to 30 second bilat without increase in pain and UE assit to promote greater bilat knee stability for decreased risk of further injury.    Baseline R:13 sec L: 9 sec. Pt display more difficulty with SLS on left compared to right with more aberrant movement.    Time 6    Period Weeks    Status New    Target Date 12/19/20                   Plan - 11/12/20 1308     Clinical Impression Statement Pt tolerated 2nd treatment very well without increase in L knee pain. Pt stated mod fatigue of bilat quads at the end of tx. Pt displayed good mechanics with STS and could be progression to squats soon. Pt continues to display low levels of pain during all mobility and mild L antalgic gait pattern. Pt. will continue to benefit from skilled physical therapy to progress POC to address remaining deficits to facilitate maximum functional capacity for optimal personal health and wellness for ADLs.    Personal Factors and Comorbidities Time since onset of injury/illness/exacerbation;Fitness;Profession    Examination-Activity Limitations Sit;Sleep;Bend;Lift;Squat;Stairs    Examination-Participation Restrictions Occupation;Shop    Stability/Clinical Decision Making Evolving/Moderate complexity    Clinical Decision Making Moderate    Rehab Potential Good    PT Frequency 2x / week     PT Duration 6 weeks    PT  Treatment/Interventions ADLs/Self Care Home Management;Aquatic Therapy;Biofeedback;Cryotherapy;Electrical Stimulation;Moist Heat;Traction;Gait training;Stair training;Functional mobility training;Therapeutic activities;Neuromuscular re-education;Balance training;Therapeutic exercise;Patient/family education;Manual techniques;Passive range of motion;Dry needling;Spinal Manipulations;Joint Manipulations;Taping    PT Next Visit Plan Reassess soreness and HEP adherence.    PT Home Exercise Plan [id number]             Patient will benefit from skilled therapeutic intervention in order to improve the following deficits and impairments:  Abnormal gait, Decreased activity tolerance, Decreased balance, Decreased endurance, Decreased mobility, Decreased range of motion, Difficulty walking, Hypomobility, Increased edema, Impaired flexibility, Pain, Improper body mechanics, Obesity  Visit Diagnosis: Chronic pain of left knee  Generally unsteady  Antalgic gait  Decreased ROM of left knee     Problem List Patient Active Problem List   Diagnosis Date Noted   Primary osteoarthritis of left knee 10/29/2020   Tear of medial meniscus of right knee, current 10/29/2020   Lesion of bone of knee 10/29/2020   Cammie Mcgee, PT, DPT # [id number] Lawernce Ion SPT 11/12/2020, 2:58 PM  [facility] [address] [place], [place], [address] Phone: [phone]   Fax:  [phone]  Name: Turner Kunzman MRN: [id number] Date of Birth: 22-Jan-1981

## 2020-11-14 ENCOUNTER — Other Ambulatory Visit: Payer: Self-pay

## 2020-11-14 ENCOUNTER — Encounter: Payer: Self-pay | Admitting: Physical Therapy

## 2020-11-14 ENCOUNTER — Ambulatory Visit: Payer: BC Managed Care – PPO | Admitting: Physical Therapy

## 2020-11-14 DIAGNOSIS — R2681 Unsteadiness on feet: Secondary | ICD-10-CM | POA: Diagnosis not present

## 2020-11-14 DIAGNOSIS — G8929 Other chronic pain: Secondary | ICD-10-CM

## 2020-11-14 DIAGNOSIS — M25662 Stiffness of left knee, not elsewhere classified: Secondary | ICD-10-CM | POA: Diagnosis not present

## 2020-11-14 DIAGNOSIS — R2689 Other abnormalities of gait and mobility: Secondary | ICD-10-CM

## 2020-11-14 DIAGNOSIS — M25562 Pain in left knee: Secondary | ICD-10-CM | POA: Diagnosis not present

## 2020-11-14 NOTE — Therapy (Signed)
[facility] [address] [place], [place], [address] Phone: [phone]   Fax:  [phone]  Physical Therapy Treatment  Patient Details  Name: Teresa Koch MRN: [id number] Date of Birth: [birth year] Referring Provider (PT): Joseph Berkshire MD   Encounter Date: 11/14/2020   PT End of Session - 11/14/20 1310     Visit Number 3    Number of Visits 12    Date for PT Re-Evaluation 12/19/20    Authorization - Visit Number 3    Authorization - Number of Visits 10    Progress Note Due on Visit 10    PT Start Time 1306    PT Stop Time 1347    PT Time Calculation (min) 41 min    Activity Tolerance Patient tolerated treatment well;No increased pain    Behavior During Therapy WFL for tasks assessed/performed             Past Medical History:  Diagnosis Date   Thyroid disease    hashimotos    Past Surgical History:  Procedure Laterality Date   CESAREAN SECTION     x1   ECTOPIC PREGNANCY SURGERY      There were no vitals filed for this visit.   Subjective Assessment - 11/14/20 1308     Subjective Pt present to tx with 1/10 L knee pain. Pt states that she had mod soreness after last tx that reduced to baseline within 24 hours.    Pertinent History x-ray dated 06/17/2020 reveals moderate to severe medial tibiofemoral osteoarthritis, subtle angulation of the proximal fibula. MRI 08/02/2020: Medial meniscus of L knee, Lesion of L Promixal fibula, L knee OA. Pt works from home and is required to travel often for her job 2x months. Her kids are into sports and enjoys going to the games and shopping. Currently not physical activity.    Limitations Sitting;House hold activities    How long can you sit comfortably? 2 hours    How long can you stand comfortably? WFL    How long can you walk comfortably? WFL    Diagnostic tests x-ray and MRI: OA, Subtle angulation of proximal medial meniscus tear of L knee    Patient Stated Goals  Management of pain, and amb up and down stairs in the home without pain and UE assist.    Currently in Pain? Yes    Pain Score 1     Pain Location Knee    Pain Orientation Left    Pain Descriptors / Indicators Aching    Pain Type Chronic pain    Pain Onset More than a month ago    Pain Frequency Occasional             Treatment    Therapeutic Exercise:   NuStep on position 10, level 4 without UE assist. Pt reported no increase knee pain   // Bars with touch assist and occ verbal cueing to proper form and function.  1) Lateral band walks with RTB 5 laps   2) partial walking lunges x3 laps  3) Walking hamstring curls x 3 laps with 4# ankle weights bilat.        Airex stability with 1 min in each position with verbal cueing for core activation. 1) standard stance width  2) feet together  3)tandem x2  4) SLS x2    Pt required occ touch assist during tandem and SLS for both LE.         Manual Therapy:  IASTM with hypervolt to distal L quad and light pressure over patella tendon and medial knee. X11 mins              PT Long Term Goals - 11/07/20 1442       PT LONG TERM GOAL #1   Title Pt will decrease L knee pain as reported on NPRS by at least 2 points in order to demonstrate clinically significant reduction in back pain.    Baseline 5/10 at worst    Time 6    Period Weeks    Status New    Target Date 12/19/20      PT LONG TERM GOAL #2   Title Pt will improve foto score to 69 to display reported return to optimal funtion and independence with home and community related ADLs    Baseline 52/56 on initial evaluation    Time 6    Period Weeks    Status New    Target Date 12/19/20      PT LONG TERM GOAL #3   Title Pt will improve L knee ext ROM in supine and hamstring mobilty equal to R side with pain less than 2/10 to promote a normalized gait pattern.    Baseline supine ROM lacking 5 deg. L hamstring deficits lacking 20 deg compared to R    Time 6     Period Weeks    Status New    Target Date 12/19/20      PT LONG TERM GOAL #4   Title Pt will improve SLS to 30 second bilat without increase in pain and UE assit to promote greater bilat knee stability for decreased risk of further injury.    Baseline R:13 sec L: 9 sec. Pt display more difficulty with SLS on left compared to right with more aberrant movement.    Time 6    Period Weeks    Status New    Target Date 12/19/20                   Plan - 11/14/20 1310     Clinical Impression Statement Pt tolerated tx well with progression of // bars therex. Pt stated bilat quad fatigue at the end of tx but no increase in pain. Pt stated reduction in L knee pain to 0/10 and displayed no antalgic gait post manual intervention. Pt continues to display LE fatigue with exercise that leads to increased risk of re-injury. Pt. will continue to benefit from skilled physical therapy to progress POC to address remaining deficits to facilitate maximum functional capacity for optimal personal health and wellness for ADLs.    Personal Factors and Comorbidities Time since onset of injury/illness/exacerbation;Fitness;Profession    Examination-Activity Limitations Sit;Sleep;Bend;Lift;Squat;Stairs    Examination-Participation Restrictions Occupation;Shop    Stability/Clinical Decision Making Evolving/Moderate complexity    Clinical Decision Making Moderate    Rehab Potential Good    PT Frequency 2x / week    PT Duration 6 weeks    PT Treatment/Interventions ADLs/Self Care Home Management;Aquatic Therapy;Biofeedback;Cryotherapy;Electrical Stimulation;Moist Heat;Traction;Gait training;Stair training;Functional mobility training;Therapeutic activities;Neuromuscular re-education;Balance training;Therapeutic exercise;Patient/family education;Manual techniques;Passive range of motion;Dry needling;Spinal Manipulations;Joint Manipulations;Taping    PT Next Visit Plan Reassess soreness and HEP adherence.  Possible update in HEP.    PT Home Exercise Plan [email or web address]             Patient will benefit from skilled therapeutic intervention in order to improve the following deficits and impairments:  Abnormal gait, Decreased activity tolerance, Decreased balance, Decreased endurance,  Decreased mobility, Decreased range of motion, Difficulty walking, Hypomobility, Increased edema, Impaired flexibility, Pain, Improper body mechanics, Obesity  Visit Diagnosis: Chronic pain of left knee  Generally unsteady  Antalgic gait  Decreased ROM of left knee     Problem List Patient Active Problem List   Diagnosis Date Noted   Primary osteoarthritis of left knee 10/29/2020   Tear of medial meniscus of right knee, current 10/29/2020   Lesion of bone of knee 10/29/2020   Cammie Mcgee, PT, DPT # [id number] Lawernce Ion, SPT 11/15/2020, 10:45 AM  [facility] [address] [place], [place], [address] Phone: [phone]   Fax:  [phone]  Name: Teresa Koch MRN: [id number] Date of Birth: 1980/10/25

## 2020-11-19 ENCOUNTER — Other Ambulatory Visit: Payer: Self-pay

## 2020-11-19 ENCOUNTER — Encounter: Payer: Self-pay | Admitting: Physical Therapy

## 2020-11-19 ENCOUNTER — Ambulatory Visit: Payer: BC Managed Care – PPO | Admitting: Physical Therapy

## 2020-11-19 DIAGNOSIS — M25662 Stiffness of left knee, not elsewhere classified: Secondary | ICD-10-CM

## 2020-11-19 DIAGNOSIS — G8929 Other chronic pain: Secondary | ICD-10-CM | POA: Diagnosis not present

## 2020-11-19 DIAGNOSIS — R2689 Other abnormalities of gait and mobility: Secondary | ICD-10-CM

## 2020-11-19 DIAGNOSIS — M25562 Pain in left knee: Secondary | ICD-10-CM | POA: Diagnosis not present

## 2020-11-19 DIAGNOSIS — R2681 Unsteadiness on feet: Secondary | ICD-10-CM

## 2020-11-19 NOTE — Therapy (Signed)
[facility] [address] [place], [place], [address] Phone: [phone]   Fax:  [phone]  Physical Therapy Treatment  Patient Details  Name: Teresa Koch MRN: [id number] Date of Birth: 03-Mar-1981 Referring Provider (PT): Joseph Berkshire MD   Encounter Date: 11/19/2020   PT End of Session - 11/19/20 1302     Visit Number 4    Number of Visits 12    Date for PT Re-Evaluation 12/19/20    Authorization - Visit Number 4    Authorization - Number of Visits 10    Progress Note Due on Visit 10    PT Start Time 1258    PT Stop Time 1343    PT Time Calculation (min) 45 min    Activity Tolerance Patient tolerated treatment well;No increased pain    Behavior During Therapy WFL for tasks assessed/performed             Past Medical History:  Diagnosis Date   Thyroid disease    hashimotos    Past Surgical History:  Procedure Laterality Date   CESAREAN SECTION     x1   ECTOPIC PREGNANCY SURGERY      There were no vitals filed for this visit.   Subjective Assessment - 11/19/20 1258     Subjective Pt presented with no new complaints of L knee pain. Pt was able to sit through a 3 hour show without an increase in knee pain. Pt states 1/10 pain at the start of tx.    Pertinent History x-ray dated 06/17/2020 reveals moderate to severe medial tibiofemoral osteoarthritis, subtle angulation of the proximal fibula. MRI 08/02/2020: Medial meniscus of L knee, Lesion of L Promixal fibula, L knee OA. Pt works from home and is required to travel often for her job 2x months. Her kids are into sports and enjoys going to the games and shopping. Currently not physical activity.    Limitations Sitting;House hold activities    How long can you sit comfortably? 2 hours    How long can you stand comfortably? WFL    How long can you walk comfortably? WFL    Diagnostic tests x-ray and MRI: OA, Subtle angulation of proximal medial meniscus tear of L  knee    Patient Stated Goals Management of pain, and amb up and down stairs in the home without pain and UE assist.    Currently in Pain? Yes    Pain Score 1     Pain Location Knee    Pain Orientation Left    Pain Descriptors / Indicators Aching    Pain Type Chronic pain    Pain Onset More than a month ago               Treatment    Therapeutic Exercise:   NuStep on position 10, level 4 without UE assist. Pt reported no increase knee pain   Access Code: [email or web address] URL: [email or web address] Date: 11/19/2020 Prepared by: Dorene Grebe Sets and reps performed in clinic to ensure proper form and technique.  Exercises  Seated Hamstring Stretch - 1 x daily - 5 x weekly - 3 sets - 10 reps Heel Raises with Counter Support - 1 x daily - 5 x weekly - 3 sets - 10 reps Side Stepping with Resistance at Thighs - 1 x daily - 7 x weekly - 4 sets - 15 reps Forward Monster Walk with Resistance (BKA) - 1 x daily - 7 x weekly - 4 sets -  15 reps Backward Monster Walk with Resistance (BKA) - 1 x daily - 7 x weekly - 4 sets - 15 reps      STS/squat 3x10 with the plinth on the lowest setting without UE assist and only post hip touch to facilitate proper squatting depth.           Manual Therapy:   IASTM with hypervolt to distal L quad and light pressure over patella tendon and medial knee. X9 mins       PT Education - 11/19/20 1451     Education Details Pt was educated on updated HEP.    Person(s) Educated Patient    Methods Explanation;Demonstration;Tactile cues;Verbal cues;Handout    Comprehension Verbalized understanding                 PT Long Term Goals - 11/07/20 1442       PT LONG TERM GOAL #1   Title Pt will decrease L knee pain as reported on NPRS by at least 2 points in order to demonstrate clinically significant reduction in back pain.    Baseline 5/10 at worst    Time 6    Period Weeks    Status New    Target Date 12/19/20      PT LONG  TERM GOAL #2   Title Pt will improve foto score to 69 to display reported return to optimal funtion and independence with home and community related ADLs    Baseline 52/56 on initial evaluation    Time 6    Period Weeks    Status New    Target Date 12/19/20      PT LONG TERM GOAL #3   Title Pt will improve L knee ext ROM in supine and hamstring mobilty equal to R side with pain less than 2/10 to promote a normalized gait pattern.    Baseline supine ROM lacking 5 deg. L hamstring deficits lacking 20 deg compared to R    Time 6    Period Weeks    Status New    Target Date 12/19/20      PT LONG TERM GOAL #4   Title Pt will improve SLS to 30 second bilat without increase in pain and UE assit to promote greater bilat knee stability for decreased risk of further injury.    Baseline R:13 sec L: 9 sec. Pt display more difficulty with SLS on left compared to right with more aberrant movement.    Time 6    Period Weeks    Status New    Target Date 12/19/20                   Plan - 11/19/20 1303     Clinical Impression Statement Today's tx was focused on updated HEP to promote greater hip/knee strengthening to facilitate decrease pain in the L knee. Pt continues to display quick fatigue in LE muscles during therex. Pt. will continue to benefit from skilled physical therapy to progress POC to address remaining deficits to facilitate maximum functional capacity for optimal personal health and wellness for ADLs.    Personal Factors and Comorbidities Time since onset of injury/illness/exacerbation;Fitness;Profession    Examination-Activity Limitations Sit;Sleep;Bend;Lift;Squat;Stairs    Examination-Participation Restrictions Occupation;Shop    Stability/Clinical Decision Making Evolving/Moderate complexity    Clinical Decision Making Moderate    Rehab Potential Good    PT Frequency 2x / week    PT Duration 6 weeks    PT Treatment/Interventions ADLs/Self Care Home Management;Aquatic  Therapy;Biofeedback;Cryotherapy;Electrical Stimulation;Moist Heat;Traction;Gait training;Stair training;Functional mobility training;Therapeutic activities;Neuromuscular re-education;Balance training;Therapeutic exercise;Patient/family education;Manual techniques;Passive range of motion;Dry needling;Spinal Manipulations;Joint Manipulations;Taping    PT Next Visit Plan Reassess soreness and HEP adherence to the new program.    PT Home Exercise Plan [email or web address]             Patient will benefit from skilled therapeutic intervention in order to improve the following deficits and impairments:  Abnormal gait, Decreased activity tolerance, Decreased balance, Decreased endurance, Decreased mobility, Decreased range of motion, Difficulty walking, Hypomobility, Increased edema, Impaired flexibility, Pain, Improper body mechanics, Obesity  Visit Diagnosis: Chronic pain of left knee  Generally unsteady  Antalgic gait  Decreased ROM of left knee     Problem List Patient Active Problem List   Diagnosis Date Noted   Primary osteoarthritis of left knee 10/29/2020   Tear of medial meniscus of right knee, current 10/29/2020   Lesion of bone of knee 10/29/2020   Cammie Mcgee, PT, DPT # [id number] Lawernce Ion SPT 11/19/2020, 4:29 PM  [facility] [address] [place], [place], [address] Phone: [phone]   Fax:  [phone]  Name: Kenndra Morris MRN: [id number] Date of Birth: 1980-07-12

## 2020-11-19 NOTE — Patient Instructions (Signed)
Access Code: [email or web address] URL: [email or web address] Date: 11/19/2020 Prepared by: Dorene Grebe  Exercises  Seated Hamstring Stretch - 1 x daily - 5 x weekly - 3 sets - 10 reps Heel Raises with Counter Support - 1 x daily - 5 x weekly - 3 sets - 10 reps Side Stepping with Resistance at Thighs - 1 x daily - 7 x weekly - 4 sets - 15 reps Forward Monster Walk with Resistance (BKA) - 1 x daily - 7 x weekly - 4 sets - 15 reps Backward Monster Walk with Resistance (BKA) - 1 x daily - 7 x weekly - 4 sets - 15 reps

## 2020-11-21 ENCOUNTER — Ambulatory Visit: Payer: BC Managed Care – PPO | Admitting: Physical Therapy

## 2020-11-21 ENCOUNTER — Other Ambulatory Visit: Payer: Self-pay

## 2020-11-21 ENCOUNTER — Encounter: Payer: Self-pay | Admitting: Physical Therapy

## 2020-11-21 DIAGNOSIS — R2689 Other abnormalities of gait and mobility: Secondary | ICD-10-CM

## 2020-11-21 DIAGNOSIS — G8929 Other chronic pain: Secondary | ICD-10-CM | POA: Diagnosis not present

## 2020-11-21 DIAGNOSIS — R2681 Unsteadiness on feet: Secondary | ICD-10-CM | POA: Diagnosis not present

## 2020-11-21 DIAGNOSIS — M25662 Stiffness of left knee, not elsewhere classified: Secondary | ICD-10-CM | POA: Diagnosis not present

## 2020-11-21 DIAGNOSIS — M25562 Pain in left knee: Secondary | ICD-10-CM | POA: Diagnosis not present

## 2020-11-21 NOTE — Therapy (Signed)
[facility] [address] [place], [place], [address] Phone: [phone]   Fax:  [phone]  Physical Therapy Treatment  Patient Details  Name: Teresa Koch MRN: [id number] Date of Birth: 1980/08/19 Referring Provider (PT): Joseph Berkshire MD   Encounter Date: 11/21/2020   PT End of Session - 11/21/20 1305     Visit Number 5    Number of Visits 12    Date for PT Re-Evaluation 12/19/20    Authorization - Visit Number 5    Authorization - Number of Visits 10    Progress Note Due on Visit 10    PT Start Time 1301    PT Stop Time 1346    PT Time Calculation (min) 45 min    Activity Tolerance Patient tolerated treatment well;No increased pain    Behavior During Therapy WFL for tasks assessed/performed             Past Medical History:  Diagnosis Date   Thyroid disease    hashimotos    Past Surgical History:  Procedure Laterality Date   CESAREAN SECTION     x1   ECTOPIC PREGNANCY SURGERY      There were no vitals filed for this visit.   Subjective Assessment - 11/21/20 1300     Subjective Pt present to tx with 0/10 knee pain. Pt stated that she had minor/moderate soreness after last tx that returned to baseline within 24 hrs. Pt states good adherence to new HEP and stated that the RTB was a good resistance.    Pertinent History x-ray dated 06/17/2020 reveals moderate to severe medial tibiofemoral osteoarthritis, subtle angulation of the proximal fibula. MRI 08/02/2020: Medial meniscus of L knee, Lesion of L Promixal fibula, L knee OA. Pt works from home and is required to travel often for her job 2x months. Her kids are into sports and enjoys going to the games and shopping. Currently not physical activity.    Limitations Sitting;House hold activities    How long can you sit comfortably? 2 hours    How long can you stand comfortably? WFL    How long can you walk comfortably? WFL    Diagnostic tests x-ray and MRI: OA,  Subtle angulation of proximal medial meniscus tear of L knee    Patient Stated Goals Management of pain, and amb up and down stairs in the home without pain and UE assist.    Currently in Pain? No/denies    Pain Score 0-No pain    Pain Onset More than a month ago              Treatment    Therapeutic Exercise:   NuStep on position 10, level 4 without UE assist. Pt reported no increase knee pain      STS/squat 3x10 with the plinth on the lowest setting without UE assist and only post hip touch to facilitate proper squatting depth.   Seated LAQ and marching with 5# ankle weights bilat with verbal cueing to ensure high quality movement.   Stair training 4x3(ascend/descend) wth 5 # ankle weights bilat. Verbal cueing to ensure knee flexion in the sagittal plan to decrease medial knee loading. 1x(ascend/descend) without weights to education pt on proper mechanics for in home adherence.   Anti-multifidus twist x10 each direction with nautilus. 20 # first set, 30# second set, 40# third set.   Manual tx.:  MCL transverse massage 4 mins. Pt was educated on possible increase in soreness post MCL  IASTM with hypervolt 7 mins. Distal quad with moderate pressure. Light pressure over the medial/ant knee joint.       PT Education - 11/21/20 1304     Education Details Pt was educated on proper stair ascending/decending technique.    Person(s) Educated Patient    Methods Demonstration;Explanation;Tactile cues;Verbal cues    Comprehension Verbalized understanding                 PT Long Term Goals - 11/07/20 1442       PT LONG TERM GOAL #1   Title Pt will decrease L knee pain as reported on NPRS by at least 2 points in order to demonstrate clinically significant reduction in back pain.    Baseline 5/10 at worst    Time 6    Period Weeks    Status New    Target Date 12/19/20      PT LONG TERM GOAL #2   Title Pt will improve foto score to 69 to display reported return to  optimal funtion and independence with home and community related ADLs    Baseline 52/56 on initial evaluation    Time 6    Period Weeks    Status New    Target Date 12/19/20      PT LONG TERM GOAL #3   Title Pt will improve L knee ext ROM in supine and hamstring mobilty equal to R side with pain less than 2/10 to promote a normalized gait pattern.    Baseline supine ROM lacking 5 deg. L hamstring deficits lacking 20 deg compared to R    Time 6    Period Weeks    Status New    Target Date 12/19/20      PT LONG TERM GOAL #4   Title Pt will improve SLS to 30 second bilat without increase in pain and UE assit to promote greater bilat knee stability for decreased risk of further injury.    Baseline R:13 sec L: 9 sec. Pt display more difficulty with SLS on left compared to right with more aberrant movement.    Time 6    Period Weeks    Status New    Target Date 12/19/20                   Plan - 11/21/20 1305     Clinical Impression Statement Today's tx was focused on increasing tissue loading with addition of Nautilus, and stair training with ankle weights. Pt reported no increase in knee pain post intervention. Pt continues to display LE endurance, strength, and ROM deficits resulting in increased pain during stairs especially when carrying objects. Pt. will continue to benefit from skilled physical therapy to progress POC to address remaining deficits to facilitate maximum functional capacity for optimal personal health and wellness for ADLs.    Personal Factors and Comorbidities Time since onset of injury/illness/exacerbation;Fitness;Profession    Examination-Activity Limitations Sit;Sleep;Bend;Lift;Squat;Stairs    Examination-Participation Restrictions Occupation;Shop    Stability/Clinical Decision Making Evolving/Moderate complexity    Clinical Decision Making Moderate    Rehab Potential Good    PT Frequency 2x / week    PT Duration 6 weeks    PT Treatment/Interventions  ADLs/Self Care Home Management;Aquatic Therapy;Biofeedback;Cryotherapy;Electrical Stimulation;Moist Heat;Traction;Gait training;Stair training;Functional mobility training;Therapeutic activities;Neuromuscular re-education;Balance training;Therapeutic exercise;Patient/family education;Manual techniques;Passive range of motion;Dry needling;Spinal Manipulations;Joint Manipulations;Taping    PT Next Visit Plan Reassess soreness and HEP adherence to the new program.    PT Home Exercise Plan [email or web address]  Patient will benefit from skilled therapeutic intervention in order to improve the following deficits and impairments:  Abnormal gait, Decreased activity tolerance, Decreased balance, Decreased endurance, Decreased mobility, Decreased range of motion, Difficulty walking, Hypomobility, Increased edema, Impaired flexibility, Pain, Improper body mechanics, Obesity  Visit Diagnosis: Chronic pain of left knee  Antalgic gait  Generally unsteady  Decreased ROM of left knee     Problem List Patient Active Problem List   Diagnosis Date Noted   Primary osteoarthritis of left knee 10/29/2020   Tear of medial meniscus of right knee, current 10/29/2020   Lesion of bone of knee 10/29/2020   Cammie Mcgee, PT, DPT # [id number] Lawernce Ion SPT 11/21/2020, 5:11 PM  [facility] [address] [place], [place], [address] Phone: [phone]   Fax:  [phone]  Name: Hadasa Gasner MRN: [id number] Date of Birth: [birth year]

## 2020-11-25 ENCOUNTER — Telehealth: Payer: Self-pay | Admitting: Family Medicine

## 2020-11-25 NOTE — Telephone Encounter (Signed)
Left message for patient to return call. Case was submitted through [employer] portal and approved only if the patient meets deductible ($4500, Met: $4500) and out of pocket max ($7350, Met: 4738.43).  Can see if patient is eligible for patient assistance.  Pending return call.

## 2020-11-25 NOTE — Telephone Encounter (Signed)
patient called back in regards to message. Please call back when available

## 2020-11-25 NOTE — Telephone Encounter (Signed)
Pt is calling to check on the status of case submitted for left knee viscosupplementation through the [employer] portal CB- [phone]

## 2020-11-26 ENCOUNTER — Encounter: Payer: Self-pay | Admitting: Physical Therapy

## 2020-11-26 ENCOUNTER — Ambulatory Visit: Payer: BC Managed Care – PPO | Admitting: Physical Therapy

## 2020-11-26 ENCOUNTER — Other Ambulatory Visit: Payer: Self-pay

## 2020-11-26 DIAGNOSIS — M25562 Pain in left knee: Secondary | ICD-10-CM | POA: Diagnosis not present

## 2020-11-26 DIAGNOSIS — R2681 Unsteadiness on feet: Secondary | ICD-10-CM | POA: Diagnosis not present

## 2020-11-26 DIAGNOSIS — M25662 Stiffness of left knee, not elsewhere classified: Secondary | ICD-10-CM | POA: Diagnosis not present

## 2020-11-26 DIAGNOSIS — G8929 Other chronic pain: Secondary | ICD-10-CM

## 2020-11-26 DIAGNOSIS — R2689 Other abnormalities of gait and mobility: Secondary | ICD-10-CM | POA: Diagnosis not present

## 2020-11-26 NOTE — Therapy (Signed)
[place] [facility] [address] [place], [place], [address] Phone: [phone]   Fax:  [phone]  Physical Therapy Treatment  Patient Details  Name: Teresa Koch MRN: [id number] Date of Birth: September 08, 1980 Referring Provider (PT): Joseph Berkshire MD   Encounter Date: 11/26/2020   PT End of Session - 11/26/20 1308     Visit Number 6    Number of Visits 12    Date for PT Re-Evaluation 12/19/20    Authorization - Visit Number 6    Authorization - Number of Visits 10    Progress Note Due on Visit 10    PT Start Time 1302    PT Stop Time 1344    PT Time Calculation (min) 42 min    Activity Tolerance Patient tolerated treatment well;No increased pain    Behavior During Therapy WFL for tasks assessed/performed             Past Medical History:  Diagnosis Date   Thyroid disease    hashimotos    Past Surgical History:  Procedure Laterality Date   CESAREAN SECTION     x1   ECTOPIC PREGNANCY SURGERY      There were no vitals filed for this visit.   Subjective Assessment - 11/26/20 1302     Subjective Pt stated no pain at the start of tx. and moderate soreness at the medial knee post transverse friction massage of the L MCL    Pertinent History x-ray dated 06/17/2020 reveals moderate to severe medial tibiofemoral osteoarthritis, subtle angulation of the proximal fibula. MRI 08/02/2020: Medial meniscus of L knee, Lesion of L Promixal fibula, L knee OA. Pt works from home and is required to travel often for her job 2x months. Her kids are into sports and enjoys going to the games and shopping. Currently not physical activity.    How long can you sit comfortably? 2 hours    How long can you stand comfortably? WFL    How long can you walk comfortably? WFL    Diagnostic tests x-ray and MRI: OA, Subtle angulation of proximal medial meniscus tear of L knee    Patient Stated Goals Management of pain, and amb up and down stairs in the home  without pain and UE assist.    Currently in Pain? No/denies    Pain Score 0-No pain             Treatment    Therapeutic Exercise:   NuStep on position 10, level 4 without UE assist. Pt reported no increase knee pain      STS/squat 3x10 with the plinth on the lowest setting without UE assist and only post hip touch to facilitate proper squatting depth.    Single leg squat from raised plinth to allow proper from. X10 each leg with concentric focus and x10 with concentric/eccentric focus. Pt required touch assist to maintain balance.    Single leg balance 3x 30 second each leg with foot on ground. And 3x 30 seconds each leg with airex pad. Pt required occ touch assist to maintain balance.      Manual tx 8 mins:   MCL transverse massage . Pt was educated on possible increase in soreness post MCL    IASTM with hypervolt . Distal quad with moderate pressure. Light pressure over the medial/ant knee joint.     PT Education - 11/26/20 1447     Education Details Pt was educated on proper LE alignment during dynamic balance  training.    Person(s) Educated Patient    Methods Explanation;Demonstration;Tactile cues;Verbal cues    Comprehension Verbalized understanding;Need further instruction             PT Long Term Goals - 11/07/20 1442       PT LONG TERM GOAL #1   Title Pt will decrease L knee pain as reported on NPRS by at least 2 points in order to demonstrate clinically significant reduction in back pain.    Baseline 5/10 at worst    Time 6    Period Weeks    Status New    Target Date 12/19/20      PT LONG TERM GOAL #2   Title Pt will improve foto score to 69 to display reported return to optimal funtion and independence with home and community related ADLs    Baseline 52/56 on initial evaluation    Time 6    Period Weeks    Status New    Target Date 12/19/20      PT LONG TERM GOAL #3   Title Pt will improve L knee ext ROM in supine and hamstring mobilty equal  to R side with pain less than 2/10 to promote a normalized gait pattern.    Baseline supine ROM lacking 5 deg. L hamstring deficits lacking 20 deg compared to R    Time 6    Period Weeks    Status New    Target Date 12/19/20      PT LONG TERM GOAL #4   Title Pt will improve SLS to 30 second bilat without increase in pain and UE assit to promote greater bilat knee stability for decreased risk of further injury.    Baseline R:13 sec L: 9 sec. Pt display more difficulty with SLS on left compared to right with more aberrant movement.    Time 6    Period Weeks    Status New    Target Date 12/19/20                   Plan - 11/26/20 1439     Clinical Impression Statement Pt tolerated tx well with addition of single leg squats and SLS on airex pad without increase in pain. Pt was educated on possible soreness associated with transverse friction STM to the MCL. Pt continues to display mark LE endurance, strength, and ROM deficits resulting in, increased pain, and limited access to QOL. Pt. will continue to benefit from skilled physical therapy to progress POC to address remaining deficits to facilitate maximum functional capacity for optimal personal health and wellness for ADLs.    Personal Factors and Comorbidities Time since onset of injury/illness/exacerbation;Fitness;Profession    Examination-Activity Limitations Sit;Sleep;Bend;Lift;Squat;Stairs    Examination-Participation Restrictions Occupation;Shop    Stability/Clinical Decision Making Evolving/Moderate complexity    Clinical Decision Making Moderate    Rehab Potential Good    PT Frequency 2x / week    PT Duration 6 weeks    PT Treatment/Interventions ADLs/Self Care Home Management;Aquatic Therapy;Biofeedback;Cryotherapy;Electrical Stimulation;Moist Heat;Traction;Gait training;Stair training;Functional mobility training;Therapeutic activities;Neuromuscular re-education;Balance training;Therapeutic exercise;Patient/family  education;Manual techniques;Passive range of motion;Dry needling;Spinal Manipulations;Joint Manipulations;Taping    PT Next Visit Plan Progress HEP    PT Home Exercise Plan [email or web address]             Patient will benefit from skilled therapeutic intervention in order to improve the following deficits and impairments:  Abnormal gait, Decreased activity tolerance, Decreased balance, Decreased endurance, Decreased mobility, Decreased range of motion, Difficulty walking,  Hypomobility, Increased edema, Impaired flexibility, Pain, Improper body mechanics, Obesity  Visit Diagnosis: Chronic pain of left knee  Generally unsteady  Antalgic gait  Decreased ROM of left knee     Problem List Patient Active Problem List   Diagnosis Date Noted   Primary osteoarthritis of left knee 10/29/2020   Tear of medial meniscus of right knee, current 10/29/2020   Lesion of bone of knee 10/29/2020    Teresa Koch, PT, DPT # [id number] Teresa Koch SPT 11/27/2020, 8:25 AM   [facility] [address] [place], [place], [address] Phone: [phone]   Fax:  [phone]  Name: Teresa Koch MRN: [id number] Date of Birth: 06/06/1980

## 2020-11-26 NOTE — Telephone Encounter (Signed)
Spoke with patient and she has agreed to try getting viscosupplementation through patient assistance.  Will start the application process.

## 2020-11-28 ENCOUNTER — Encounter: Payer: BC Managed Care – PPO | Admitting: Physical Therapy

## 2020-12-02 ENCOUNTER — Other Ambulatory Visit: Payer: Self-pay | Admitting: Family Medicine

## 2020-12-02 DIAGNOSIS — E038 Other specified hypothyroidism: Secondary | ICD-10-CM

## 2020-12-02 DIAGNOSIS — E063 Autoimmune thyroiditis: Secondary | ICD-10-CM

## 2020-12-03 ENCOUNTER — Ambulatory Visit: Payer: BC Managed Care – PPO | Attending: Family Medicine | Admitting: Physical Therapy

## 2020-12-03 ENCOUNTER — Other Ambulatory Visit: Payer: Self-pay

## 2020-12-03 ENCOUNTER — Encounter: Payer: Self-pay | Admitting: Physical Therapy

## 2020-12-03 DIAGNOSIS — R2689 Other abnormalities of gait and mobility: Secondary | ICD-10-CM

## 2020-12-03 DIAGNOSIS — R2681 Unsteadiness on feet: Secondary | ICD-10-CM | POA: Diagnosis not present

## 2020-12-03 DIAGNOSIS — M25562 Pain in left knee: Secondary | ICD-10-CM | POA: Insufficient documentation

## 2020-12-03 DIAGNOSIS — G8929 Other chronic pain: Secondary | ICD-10-CM | POA: Diagnosis not present

## 2020-12-03 DIAGNOSIS — M25662 Stiffness of left knee, not elsewhere classified: Secondary | ICD-10-CM | POA: Diagnosis not present

## 2020-12-03 NOTE — Telephone Encounter (Signed)
PAP filled out.  Patient notified.  Will come sign around 1:20 PM today.

## 2020-12-03 NOTE — Telephone Encounter (Signed)
Patient signed paperwork.  Faxed.  Pending response.

## 2020-12-03 NOTE — Therapy (Signed)
[place] [facility] [address] [place], [place], [address] Phone: [phone]   Fax:  [phone]  Physical Therapy Treatment  Patient Details  Name: Teresa Koch MRN: [id number] Date of Birth: 03/29/1981 Referring Provider (PT): Joseph Berkshire MD   Encounter Date: 12/03/2020   PT End of Session - 12/03/20 1307     Visit Number 7    Number of Visits 12    Date for PT Re-Evaluation 12/19/20    Authorization - Visit Number 7    Authorization - Number of Visits 10    Progress Note Due on Visit 10    PT Start Time 1301    PT Stop Time 1346    PT Time Calculation (min) 45 min    Activity Tolerance Patient tolerated treatment well;No increased pain    Behavior During Therapy WFL for tasks assessed/performed             Past Medical History:  Diagnosis Date   Thyroid disease    hashimotos    Past Surgical History:  Procedure Laterality Date   CESAREAN SECTION     x1   ECTOPIC PREGNANCY SURGERY      There were no vitals filed for this visit.   Subjective Assessment - 12/03/20 1304     Subjective Pt stated no pain at the start of tx. Pt stated that PT tx 1x week worked well for her without increase in knee sx. Pt stated no soreness after last tx.    Pertinent History x-ray dated 06/17/2020 reveals moderate to severe medial tibiofemoral osteoarthritis, subtle angulation of the proximal fibula. MRI 08/02/2020: Medial meniscus of L knee, Lesion of L Promixal fibula, L knee OA. Pt works from home and is required to travel often for her job 2x months. Her kids are into sports and enjoys going to the games and shopping. Currently not physical activity.    Limitations Sitting;House hold activities    How long can you sit comfortably? 2 hours    How long can you stand comfortably? WFL    How long can you walk comfortably? WFL    Diagnostic tests x-ray and MRI: OA, Subtle angulation of proximal medial meniscus tear of L knee    Patient  Stated Goals Management of pain, and amb up and down stairs in the home without pain and UE assist.    Currently in Pain? No/denies    Pain Score 0-No pain    Pain Location Knee    Pain Orientation Left    Pain Descriptors / Indicators Aching               Treatment    Therapeutic Exercise:   NuStep on position 10, level 4 without UE assist. Pt reported no increase knee pain     STS/squat 2x15 with the plinth on the lowest setting without UE assist and only post hip touch to facilitate proper squatting depth.    Single leg squat from raised plinth to allow proper from. 2x10 with concentric/eccentric focus. Pt required touch assist to maintain balance.  Plinth was lower 2 '' between sets    Standing hip abd and hamstring curls x30 each leg with occ light touch for balance assistance.       Manual tx 11 mins:    IASTM with hypervolt . Distal quad with moderate pressure. Light pressure over the medial/ant knee joint.        PT Long Term Goals - 11/07/20 1442  PT LONG TERM GOAL #1   Title Pt will decrease L knee pain as reported on NPRS by at least 2 points in order to demonstrate clinically significant reduction in back pain.    Baseline 5/10 at worst    Time 6    Period Weeks    Status New    Target Date 12/19/20      PT LONG TERM GOAL #2   Title Pt will improve foto score to 69 to display reported return to optimal funtion and independence with home and community related ADLs    Baseline 52/56 on initial evaluation    Time 6    Period Weeks    Status New    Target Date 12/19/20      PT LONG TERM GOAL #3   Title Pt will improve L knee ext ROM in supine and hamstring mobilty equal to R side with pain less than 2/10 to promote a normalized gait pattern.    Baseline supine ROM lacking 5 deg. L hamstring deficits lacking 20 deg compared to R    Time 6    Period Weeks    Status New    Target Date 12/19/20      PT LONG TERM GOAL #4   Title Pt will  improve SLS to 30 second bilat without increase in pain and UE assit to promote greater bilat knee stability for decreased risk of further injury.    Baseline R:13 sec L: 9 sec. Pt display more difficulty with SLS on left compared to right with more aberrant movement.    Time 6    Period Weeks    Status New    Target Date 12/19/20                   Plan - 12/03/20 1311     Clinical Impression Statement Pt tolerated tx well with increased alignment and proper tissue loading during single leg squat. Pt c/c at this time is pain only when walking down the stairs. Pt was able to complete all tx without increase in pain or stiffness. Pt continues to display mark LE endurance, strength, and ROM deficits resulting increased pain, and limited access to QOL. Pt. will continue to benefit from skilled physical therapy to progress POC to address remaining deficits to facilitate maximum functional capacity for optimal personal health and wellness for ADLs.    Personal Factors and Comorbidities Time since onset of injury/illness/exacerbation;Fitness;Profession    Examination-Activity Limitations Sit;Sleep;Bend;Lift;Squat;Stairs    Examination-Participation Restrictions Occupation;Shop    Stability/Clinical Decision Making Evolving/Moderate complexity    Clinical Decision Making Moderate    Rehab Potential Good    PT Frequency 2x / week    PT Duration 6 weeks    PT Treatment/Interventions ADLs/Self Care Home Management;Aquatic Therapy;Biofeedback;Cryotherapy;Electrical Stimulation;Moist Heat;Traction;Gait training;Stair training;Functional mobility training;Therapeutic activities;Neuromuscular re-education;Balance training;Therapeutic exercise;Patient/family education;Manual techniques;Passive range of motion;Dry needling;Spinal Manipulations;Joint Manipulations;Taping    PT Next Visit Plan progress stair training with eccentric loading focus    PT Home Exercise Plan [email or web address]              Patient will benefit from skilled therapeutic intervention in order to improve the following deficits and impairments:  Abnormal gait, Decreased activity tolerance, Decreased balance, Decreased endurance, Decreased mobility, Decreased range of motion, Difficulty walking, Hypomobility, Increased edema, Impaired flexibility, Pain, Improper body mechanics, Obesity  Visit Diagnosis: Chronic pain of left knee  Generally unsteady  Antalgic gait  Decreased ROM of left knee     Problem  List Patient Active Problem List   Diagnosis Date Noted   Primary osteoarthritis of left knee 10/29/2020   Tear of medial meniscus of right knee, current 10/29/2020   Lesion of bone of knee 10/29/2020    Cammie Mcgee, PT, DPT # [id number] Lawernce Ion SPT 12/03/2020, 3:38 PM  [facility] [address] [place], [place], [address] Phone: [phone]   Fax:  [phone]  Name: Teresa Koch MRN: [id number] Date of Birth: Mar 13, 1981

## 2020-12-05 ENCOUNTER — Encounter: Payer: BC Managed Care – PPO | Admitting: Physical Therapy

## 2020-12-06 NOTE — Telephone Encounter (Signed)
Received approval letter for patient assistance through [employer].  Signed confirmation for address and provider and faxed back.  Will schedule patient once gel injection is received.

## 2020-12-09 NOTE — Telephone Encounter (Signed)
Patient notified and verbalized understanding.  No further questions at this time. For your information.

## 2020-12-10 ENCOUNTER — Other Ambulatory Visit: Payer: Self-pay

## 2020-12-10 ENCOUNTER — Ambulatory Visit: Payer: BC Managed Care – PPO | Admitting: Physical Therapy

## 2020-12-10 ENCOUNTER — Encounter: Payer: Self-pay | Admitting: Physical Therapy

## 2020-12-10 DIAGNOSIS — R2681 Unsteadiness on feet: Secondary | ICD-10-CM | POA: Diagnosis not present

## 2020-12-10 DIAGNOSIS — M25562 Pain in left knee: Secondary | ICD-10-CM

## 2020-12-10 DIAGNOSIS — M25662 Stiffness of left knee, not elsewhere classified: Secondary | ICD-10-CM | POA: Diagnosis not present

## 2020-12-10 DIAGNOSIS — G8929 Other chronic pain: Secondary | ICD-10-CM | POA: Diagnosis not present

## 2020-12-10 DIAGNOSIS — R2689 Other abnormalities of gait and mobility: Secondary | ICD-10-CM | POA: Diagnosis not present

## 2020-12-10 NOTE — Therapy (Signed)
[facility] [address] [place], [place], [address] Phone: [phone]   Fax:  [phone]  Physical Therapy Treatment  Patient Details  Name: Teresa Koch MRN: [id number] Date of Birth: 06/05/1980 Referring Provider (PT): Joseph Berkshire MD   Encounter Date: 12/10/2020   PT End of Session - 12/10/20 1306     Visit Number 8    Number of Visits 12    Date for PT Re-Evaluation 12/19/20    Authorization - Visit Number 8    Authorization - Number of Visits 10    Progress Note Due on Visit 10    PT Start Time 1306    PT Stop Time 1347    PT Time Calculation (min) 41 min    Activity Tolerance Patient tolerated treatment well;No increased pain    Behavior During Therapy WFL for tasks assessed/performed             Past Medical History:  Diagnosis Date   Thyroid disease    hashimotos    Past Surgical History:  Procedure Laterality Date   CESAREAN SECTION     x1   ECTOPIC PREGNANCY SURGERY      There were no vitals filed for this visit.   Subjective Assessment - 12/10/20 1302     Subjective Pt states she is doing well.  No new complaints.    Pertinent History x-ray dated 06/17/2020 reveals moderate to severe medial tibiofemoral osteoarthritis, subtle angulation of the proximal fibula. MRI 08/02/2020: Medial meniscus of L knee, Lesion of L Promixal fibula, L knee OA. Pt works from home and is required to travel often for her job 2x months. Her kids are into sports and enjoys going to the games and shopping. Currently not physical activity.    Limitations Sitting;House hold activities    How long can you sit comfortably? 2 hours    How long can you stand comfortably? WFL    How long can you walk comfortably? WFL    Diagnostic tests x-ray and MRI: OA, Subtle angulation of proximal medial meniscus tear of L knee    Patient Stated Goals Management of pain, and amb up and down stairs in the home without pain and UE assist.     Currently in Pain? No/denies              Treatment    Therapeutic Exercise:   Seated LAQ and him marching with 5 # ankle weights to facilitate increased blood flow to the knees without total WB  Stairs x 8 with 5 # ankle weights bilat without complaints of pain and only occ verbal cueing for proper LE stepping pattern  12'' inch step up with same side eccentric lowering 3x5 each leg with light UE assist for proper balance.   Bilat concentric unilat eccentric calf raise x20 each leg    STS/squat 3x10 with the plinth on the lowest setting without UE assist and only post hip touch to facilitate proper squatting depth.   Airex balance 1 min in each position without UE assist and verbal cueing used for facilitate duel tasking  1) feet together  2) Tandem x2  3) SLS x2       Manual tx 12 mins:     IASTM with hypervolt . Distal quad with moderate pressure. Light pressure over the medial/ant knee joint.        PT Long Term Goals - 11/07/20 1442       PT LONG TERM GOAL #  1   Title Pt will decrease L knee pain as reported on NPRS by at least 2 points in order to demonstrate clinically significant reduction in back pain.    Baseline 5/10 at worst    Time 6    Period Weeks    Status New    Target Date 12/19/20      PT LONG TERM GOAL #2   Title Pt will improve foto score to 69 to display reported return to optimal funtion and independence with home and community related ADLs    Baseline 52/56 on initial evaluation    Time 6    Period Weeks    Status New    Target Date 12/19/20      PT LONG TERM GOAL #3   Title Pt will improve L knee ext ROM in supine and hamstring mobilty equal to R side with pain less than 2/10 to promote a normalized gait pattern.    Baseline supine ROM lacking 5 deg. L hamstring deficits lacking 20 deg compared to R    Time 6    Period Weeks    Status New    Target Date 12/19/20      PT LONG TERM GOAL #4   Title Pt will improve SLS to 30  second bilat without increase in pain and UE assit to promote greater bilat knee stability for decreased risk of further injury.    Baseline R:13 sec L: 9 sec. Pt display more difficulty with SLS on left compared to right with more aberrant movement.    Time 6    Period Weeks    Status New    Target Date 12/19/20                   Plan - 12/10/20 1401     Clinical Impression Statement Today's tx was focused on progression of dynamic power production of LE with 12'' step up with controled eccentric descent. Pt was able to complete all therapeutic intervention without pain. Pt continues to display mark LE endurance, strength, and ROM deficits resulting in increased pain, and limited access to QOL.  Pt. will continue to benefit from skilled physical therapy to progress POC to address remaining deficits to facilitate maximum functional capacity for optimal personal health and wellness for ADLs.    Personal Factors and Comorbidities Time since onset of injury/illness/exacerbation;Fitness;Profession    Examination-Activity Limitations Sit;Sleep;Bend;Lift;Squat;Stairs    Examination-Participation Restrictions Occupation;Shop    Stability/Clinical Decision Making Evolving/Moderate complexity    Clinical Decision Making Moderate    Rehab Potential Good    PT Frequency 2x / week    PT Duration 6 weeks    PT Treatment/Interventions ADLs/Self Care Home Management;Aquatic Therapy;Biofeedback;Cryotherapy;Electrical Stimulation;Moist Heat;Traction;Gait training;Stair training;Functional mobility training;Therapeutic activities;Neuromuscular re-education;Balance training;Therapeutic exercise;Patient/family education;Manual techniques;Passive range of motion;Dry needling;Spinal Manipulations;Joint Manipulations;Taping    PT Next Visit Plan progress stair training with eccentric loading focus    PT Home Exercise Plan [id number]             Patient will benefit from skilled therapeutic intervention  in order to improve the following deficits and impairments:  Abnormal gait, Decreased activity tolerance, Decreased balance, Decreased endurance, Decreased mobility, Decreased range of motion, Difficulty walking, Hypomobility, Increased edema, Impaired flexibility, Pain, Improper body mechanics, Obesity  Visit Diagnosis: Chronic pain of left knee  Generally unsteady  Antalgic gait  Decreased ROM of left knee     Problem List Patient Active Problem List   Diagnosis Date Noted   Primary  osteoarthritis of left knee 10/29/2020   Tear of medial meniscus of right knee, current 10/29/2020   Lesion of bone of knee 10/29/2020   Cammie Mcgee, PT, DPT # [id number] Lawernce Ion, SPT 12/10/2020, 3:49 PM  [facility] [address] [place], [place], [address] Phone: [phone]   Fax:  [phone]  Name: Teresa Koch MRN: [id number] Date of Birth: Jan 05, 1981

## 2020-12-12 ENCOUNTER — Encounter: Payer: BC Managed Care – PPO | Admitting: Physical Therapy

## 2020-12-17 ENCOUNTER — Encounter: Payer: BC Managed Care – PPO | Admitting: Physical Therapy

## 2020-12-17 ENCOUNTER — Encounter: Payer: Self-pay | Admitting: Physical Therapy

## 2020-12-17 ENCOUNTER — Ambulatory Visit: Payer: BC Managed Care – PPO | Admitting: Physical Therapy

## 2020-12-17 ENCOUNTER — Other Ambulatory Visit: Payer: Self-pay

## 2020-12-17 DIAGNOSIS — R2681 Unsteadiness on feet: Secondary | ICD-10-CM | POA: Diagnosis not present

## 2020-12-17 DIAGNOSIS — R2689 Other abnormalities of gait and mobility: Secondary | ICD-10-CM | POA: Diagnosis not present

## 2020-12-17 DIAGNOSIS — M25562 Pain in left knee: Secondary | ICD-10-CM | POA: Diagnosis not present

## 2020-12-17 DIAGNOSIS — G8929 Other chronic pain: Secondary | ICD-10-CM | POA: Diagnosis not present

## 2020-12-17 DIAGNOSIS — M25662 Stiffness of left knee, not elsewhere classified: Secondary | ICD-10-CM | POA: Diagnosis not present

## 2020-12-17 NOTE — Therapy (Signed)
[facility] [address] [place], [place], [address] Phone: [phone]   Fax:  [phone]  Physical Therapy Treatment  Patient Details  Name: Teresa Koch MRN: [id number] Date of Birth: [birth year] Referring Provider (PT): Joseph Berkshire MD   Encounter Date: 12/17/2020   PT End of Session - 12/17/20 1354     Visit Number 9    Number of Visits 12    Date for PT Re-Evaluation 12/19/20    Authorization - Visit Number 9    Authorization - Number of Visits 10    Progress Note Due on Visit 10    PT Start Time 1302    PT Stop Time 1348    PT Time Calculation (min) 46 min    Activity Tolerance Patient tolerated treatment well;No increased pain    Behavior During Therapy WFL for tasks assessed/performed             Past Medical History:  Diagnosis Date   Thyroid disease    hashimotos    Past Surgical History:  Procedure Laterality Date   CESAREAN SECTION     x1   ECTOPIC PREGNANCY SURGERY      There were no vitals filed for this visit.   Subjective Assessment - 12/17/20 1309     Subjective Pt states that she is doing well without sx at start of tx. Pt was able to walk and transverse several stairs this weekend on vacation without increase in knee pain. Pt would like to continue tx until after injection.    Pertinent History x-ray dated 06/17/2020 reveals moderate to severe medial tibiofemoral osteoarthritis, subtle angulation of the proximal fibula. MRI 08/02/2020: Medial meniscus of L knee, Lesion of L Promixal fibula, L knee OA. Pt works from home and is required to travel often for her job 2x months. Her kids are into sports and enjoys going to the games and shopping. Currently not physical activity.    Limitations Sitting;House hold activities    How long can you sit comfortably? 2 hours    How long can you stand comfortably? WFL    How long can you walk comfortably? WFL    Diagnostic tests x-ray and MRI: OA, Subtle  angulation of proximal medial meniscus tear of L knee    Patient Stated Goals Management of pain, and amb up and down stairs in the home without pain and UE assist.    Currently in Pain? No/denies    Pain Score 0-No pain              Treatment    Therapeutic Exercise:   NuStep L5 without UE assist with verbal cueing for proper LE alignment to facilitate proper patellar alignment.   Seated LAQ and him marching with 5 # ankle weights to facilitate increased blood flow to the knees without total WB  Standing hip abd and hamstring curl x30 each leg with 5# ankle weights. Light touch assist.    12'' inch toe taps standing on airex without UE assist.     STS/squat 15 with the plinth on the lowest setting without UE assist and only post hip touch to facilitate proper squatting depth. X15 with 5 # db in bilat UE.      Manual tx 12 mins:   IASTM with hypervolt . Distal quad with moderate pressure. Light pressure over the medial/ant knee joint        PT Long Term Goals - 11/07/20 1442  PT LONG TERM GOAL #1   Title Pt will decrease L knee pain as reported on NPRS by at least 2 points in order to demonstrate clinically significant reduction in back pain.    Baseline 5/10 at worst    Time 6    Period Weeks    Status New    Target Date 12/19/20      PT LONG TERM GOAL #2   Title Pt will improve foto score to 69 to display reported return to optimal funtion and independence with home and community related ADLs    Baseline 52/56 on initial evaluation    Time 6    Period Weeks    Status New    Target Date 12/19/20      PT LONG TERM GOAL #3   Title Pt will improve L knee ext ROM in supine and hamstring mobilty equal to R side with pain less than 2/10 to promote a normalized gait pattern.    Baseline supine ROM lacking 5 deg. L hamstring deficits lacking 20 deg compared to R    Time 6    Period Weeks    Status New    Target Date 12/19/20      PT LONG TERM GOAL #4    Title Pt will improve SLS to 30 second bilat without increase in pain and UE assit to promote greater bilat knee stability for decreased risk of further injury.    Baseline R:13 sec L: 9 sec. Pt display more difficulty with SLS on left compared to right with more aberrant movement.    Time 6    Period Weeks    Status New    Target Date 12/19/20                   Plan - 12/17/20 1354     Clinical Impression Statement Pt tolerated tx very well without increase in pain. POC will continue until after injection to facilitate pt comfort for full return to PLOF. Pt continues to display mark LE endurance, strength, and ROM deficits resulting in decreased safe home/community mobiity, increased pain, and limited access to QOL. Pt. will continue to benefit from skilled physical therapy to progress POC to address remaining deficits to facilitate maximum functional capacity for optimal personal health and wellness for ADLs.    Personal Factors and Comorbidities Time since onset of injury/illness/exacerbation;Fitness;Profession    Examination-Activity Limitations Sit;Sleep;Bend;Lift;Squat;Stairs    Examination-Participation Restrictions Occupation;Shop    Stability/Clinical Decision Making Evolving/Moderate complexity    Clinical Decision Making Moderate    Rehab Potential Good    PT Frequency 2x / week    PT Duration 6 weeks    PT Treatment/Interventions ADLs/Self Care Home Management;Aquatic Therapy;Biofeedback;Cryotherapy;Electrical Stimulation;Moist Heat;Traction;Gait training;Stair training;Functional mobility training;Therapeutic activities;Neuromuscular re-education;Balance training;Therapeutic exercise;Patient/family education;Manual techniques;Passive range of motion;Dry needling;Spinal Manipulations;Joint Manipulations;Taping    PT Next Visit Plan progress stair training with eccentric loading focus.  10th visit/ progress note.    PT Home Exercise Plan [id number]             Patient  will benefit from skilled therapeutic intervention in order to improve the following deficits and impairments:  Abnormal gait, Decreased activity tolerance, Decreased balance, Decreased endurance, Decreased mobility, Decreased range of motion, Difficulty walking, Hypomobility, Increased edema, Impaired flexibility, Pain, Improper body mechanics, Obesity  Visit Diagnosis: Chronic pain of left knee  Generally unsteady  Antalgic gait  Decreased ROM of left knee     Problem List Patient Active Problem List  Diagnosis Date Noted   Primary osteoarthritis of left knee 10/29/2020   Tear of medial meniscus of right knee, current 10/29/2020   Lesion of bone of knee 10/29/2020   Cammie Mcgee, PT, DPT # [id number] Lawernce Ion, SPT 12/17/2020, 3:17 PM  [facility] [address] [place], [place], [address] Phone: [phone]   Fax:  [phone]  Name: Teresa Koch MRN: [id number] Date of Birth: 1980-10-06

## 2020-12-19 ENCOUNTER — Encounter: Payer: BC Managed Care – PPO | Admitting: Physical Therapy

## 2020-12-23 ENCOUNTER — Telehealth: Payer: Self-pay

## 2020-12-23 NOTE — Telephone Encounter (Signed)
Copied from CRM [id number]. Topic: General - Other >> Dec 23, 2020  2:01 PM Marylen Ponto wrote: Reason for CRM: [facility] with [employer] stated the medication was returned and there was a note that the patient is not a patient of the practice. Tracey requests call back. Cb# [phone]

## 2020-12-23 NOTE — Telephone Encounter (Signed)
Left a detailed voicemail for Teresa Koch to return my call.  For your information.

## 2020-12-24 ENCOUNTER — Ambulatory Visit: Payer: BC Managed Care – PPO | Admitting: Physical Therapy

## 2020-12-24 ENCOUNTER — Other Ambulatory Visit: Payer: Self-pay

## 2020-12-24 ENCOUNTER — Encounter: Payer: Self-pay | Admitting: Physical Therapy

## 2020-12-24 DIAGNOSIS — G8929 Other chronic pain: Secondary | ICD-10-CM | POA: Diagnosis not present

## 2020-12-24 DIAGNOSIS — M25662 Stiffness of left knee, not elsewhere classified: Secondary | ICD-10-CM | POA: Diagnosis not present

## 2020-12-24 DIAGNOSIS — R2689 Other abnormalities of gait and mobility: Secondary | ICD-10-CM

## 2020-12-24 DIAGNOSIS — M25562 Pain in left knee: Secondary | ICD-10-CM | POA: Diagnosis not present

## 2020-12-24 DIAGNOSIS — R2681 Unsteadiness on feet: Secondary | ICD-10-CM

## 2020-12-24 NOTE — Therapy (Signed)
Teresa Koch [address] [place], [place], [address] Phone: [phone]   Fax:  [phone]  Physical Therapy Treatment and progress/Recert note 3/00/92-06/26/05  Patient Details  Name: Teresa Koch MRN: [id number] Date of Birth: September 22, 1980 Referring Provider (PT): Rosette Reveal MD   Encounter Date: 12/24/2020   PT End of Session - 12/24/20 1315     Visit Number 10    Number of Visits 12    Date for PT Re-Evaluation 01/09/21    Authorization - Visit Number 10    Authorization - Number of Visits 10    Progress Note Due on Visit 10    PT Start Time 5625    PT Stop Time 1346    PT Time Calculation (min) 44 min    Activity Tolerance Patient tolerated treatment well;No increased pain    Behavior During Therapy WFL for tasks assessed/performed             Past Medical History:  Diagnosis Date   Thyroid disease    hashimotos    Past Surgical History:  Procedure Laterality Date   CESAREAN SECTION     x1   ECTOPIC PREGNANCY SURGERY      There were no vitals filed for this visit.   Subjective Assessment - 12/24/20 1305     Subjective Pt presents to tx without knee pain. Pt is expecting possible injection in the next few weeks. Pt was able to complete all travel work related activities without knee pain.    Pertinent History x-ray dated 06/17/2020 reveals moderate to severe medial tibiofemoral osteoarthritis, subtle angulation of the proximal fibula. MRI 08/02/2020: Medial meniscus of L knee, Lesion of L Promixal fibula, L knee OA. Pt works from home and is required to travel often for her job 2x months. Her kids are into sports and enjoys going to the games and shopping. Currently not physical activity.    Limitations Sitting;House hold activities    How long can you sit comfortably? 2 hours    How long can you stand comfortably? WFL    How long can you walk comfortably? WFL    Diagnostic tests x-ray and MRI: OA, Subtle  angulation of proximal medial meniscus tear of L knee    Patient Stated Goals Management of pain, and amb up and down stairs in the home without pain and UE assist.    Currently in Pain? No/denies    Pain Score 0-No pain    Pain Orientation Left                Treatment    Reassessment:   FOTO: 86  NPRS: 1/10 at absolute worse.   Hamstring mobility: distal hamstring WNL: bilat 0 deg ext.  SLS: R:30 sec L: 18 sec  Addition a new goal: Pt will indepent HEP and return to maximal functional mobility after injection with worse pain 3/10 or less   Therapeutic Exercise:   NuStep L5 without UE assist with verbal cueing for proper LE alignment to facilitate proper patellar alignment.   12'' inch toe taps standing 5# ankle weights bilat and light occ assist without UE assist. Verbal and contact cueing to facilitate optimal tissue loading and correct biomechanical alignment to ensure efficient and safe movement.   5# ankle weighted stair climbing 2x4 steps forward and backwards with light bilat touch assist  Duel tasking walking forward and back in the hall with sticky note recall and random head movement commands. Pt displayed proper gait  speed and no aberrant movement during duel tasking.       PT Long Term Goals - 12/24/20 1317       PT LONG TERM GOAL #1   Title Pt will decrease L knee pain as reported on NPRS by at least 2 points in order to demonstrate clinically significant reduction in back pain.    Baseline 5/10 at worst 9/27: 1/10    Time 6    Period Weeks    Status Achieved    Target Date 12/24/20      PT LONG TERM GOAL #2   Title Pt will improve foto score to 69 to display reported return to optimal funtion and independence with home and community related ADLs    Baseline 52/56 on initial evaluation 9/27: 86 (goal achieved on 10th visit)    Time 6    Period Weeks    Status Achieved    Target Date 12/24/20      PT LONG TERM GOAL #3   Title Pt will improve  L knee ext ROM in supine and hamstring mobilty equal to R side with pain less than 2/10 to promote a normalized gait pattern.    Baseline supine ROM lacking 5 deg. L hamstring deficits lacking 20 deg compared to R 9/27: distal hamstring WNL: bilat 0 deg ext.    Time 6    Period Weeks    Status Achieved    Target Date 12/24/20      PT LONG TERM GOAL #4   Title Pt will improve SLS to 30 second bilat without increase in pain and UE assit to promote greater bilat knee stability for decreased risk of further injury.    Baseline R:30 sec L: 18 sec. Pt display more difficulty with SLS on left compared to right with more aberrant movement.    Time 6    Period Weeks    Status Partially Met    Target Date 01/09/21      PT LONG TERM GOAL #5   Title Pt will indepent HEP and return to maximal functional mobility after injection with worse pain 3/10 or less.    Baseline Pt is waiting to schedule injection this week.    Time 2    Period Weeks    Status New    Target Date 01/09/21                   Plan - 12/24/20 1405     Clinical Impression Statement Pt was reassessed on this date 12/24/20. PT reassessment displayed the following findings: FOTO: 86. NPRS: 1/10 at absolute worse. Hamstring mobility: distal hamstring WNL: bilat 0 deg ext. SLS: R:30 sec L: 18 sec. The following new goal was added: Pt will indepent HEP and return to maximal functional mobility after injection with worse pain 3/10 or less. Pt continues to display LE endurance, strength, and ROM deficits resulting in decreased safe home/community mobiity, increased pain, and limited access to QOL. Pt. will continue to benefit from skilled physical therapy 1x week for 2x additional weeks to progress POC to address remaining deficits to facilitate maximum functional capacity for optimal personal health and wellness for ADLs.    Personal Factors and Comorbidities Time since onset of injury/illness/exacerbation;Fitness;Profession     Examination-Activity Limitations Sit;Sleep;Bend;Lift;Squat;Stairs    Examination-Participation Restrictions Occupation;Shop    Stability/Clinical Decision Making Evolving/Moderate complexity    Clinical Decision Making Moderate    Rehab Potential Good    PT Frequency 1x / week  PT Duration 2 weeks    PT Treatment/Interventions ADLs/Self Care Home Management;Aquatic Therapy;Biofeedback;Cryotherapy;Electrical Stimulation;Moist Heat;Traction;Gait training;Stair training;Functional mobility training;Therapeutic activities;Neuromuscular re-education;Balance training;Therapeutic exercise;Patient/family education;Manual techniques;Passive range of motion;Dry needling;Spinal Manipulations;Joint Manipulations;Taping    PT Next Visit Plan Possible HEP update before next week d/c.    PT Home Exercise Plan [id number]             Patient will benefit from skilled therapeutic intervention in order to improve the following deficits and impairments:  Abnormal gait, Decreased activity tolerance, Decreased balance, Decreased endurance, Decreased mobility, Decreased range of motion, Difficulty walking, Hypomobility, Increased edema, Impaired flexibility, Pain, Improper body mechanics, Obesity  Visit Diagnosis: Chronic pain of left knee  Generally unsteady  Antalgic gait  Decreased ROM of left knee     Problem List Patient Active Problem List   Diagnosis Date Noted   Primary osteoarthritis of left knee 10/29/2020   Tear of medial meniscus of right knee, current 10/29/2020   Lesion of bone of knee 10/29/2020   Pura Spice, PT, DPT # [id number] Fara Olden, SPT 12/25/2020, 8:36 AM  [facility] [address] [place], [place], [address] Phone: [phone]   Fax:  [phone]  Name: Teresa Koch MRN: [id number] Date of Birth: [birth year]

## 2020-12-24 NOTE — Telephone Encounter (Signed)
Spoke with Teresa Koch and she is expediting new shipment of the Monovisc and it should arrive tomorrow.  For your information.   Patient is aware of what happened and I will schedule her as soon as we receive the injection.

## 2020-12-24 NOTE — Telephone Encounter (Signed)
Thank you, will see patient at follow-up.

## 2020-12-25 NOTE — Telephone Encounter (Signed)
Monovisc injection received by our office.  Patient called and scheduled for tomorrow, 12/26/20, at 4:20 PM.  For your information.

## 2020-12-26 ENCOUNTER — Other Ambulatory Visit: Payer: Self-pay

## 2020-12-26 ENCOUNTER — Inpatient Hospital Stay (INDEPENDENT_AMBULATORY_CARE_PROVIDER_SITE_OTHER): Payer: BC Managed Care – PPO | Admitting: Radiology

## 2020-12-26 ENCOUNTER — Ambulatory Visit (INDEPENDENT_AMBULATORY_CARE_PROVIDER_SITE_OTHER): Payer: BC Managed Care – PPO | Admitting: Family Medicine

## 2020-12-26 ENCOUNTER — Encounter: Payer: BC Managed Care – PPO | Admitting: Physical Therapy

## 2020-12-26 ENCOUNTER — Encounter: Payer: Self-pay | Admitting: Family Medicine

## 2020-12-26 VITALS — BP 118/84 | HR 78 | Ht 67.0 in | Wt 353.0 lb

## 2020-12-26 DIAGNOSIS — M1712 Unilateral primary osteoarthritis, left knee: Secondary | ICD-10-CM | POA: Diagnosis not present

## 2020-12-26 NOTE — Progress Notes (Signed)
     Primary Care / Sports Medicine Office Visit  Patient Information:  Patient ID: Teresa Koch, female DOB: 04-21-1980 Age: 40 y.o. MRN: [id number]   Teresa Koch is a pleasant 40 y.o. female presenting with the following:  Chief Complaint  Patient presents with   Primary osteoarthritis of left knee    Monovisc 88mg /69mL injection today in office; following with PT and taking diclofenac twice daily as needed;  1/10 pain    Review of Systems pertinent details above   Patient Active Problem List   Diagnosis Date Noted   Primary osteoarthritis of left knee 10/29/2020   Tear of medial meniscus of right knee, current 10/29/2020   Lesion of bone of knee 10/29/2020   Past Medical History:  Diagnosis Date   Thyroid disease    hashimotos   Outpatient Encounter Medications as of 12/26/2020  Medication Sig   diclofenac (VOLTAREN) 75 MG EC tablet TAKE 1 TABLET BY MOUTH TWICE A DAY AS NEEDED   levothyroxine (SYNTHROID) 175 MCG tablet TAKE 1 TABLET BY MOUTH EVERY DAY BEFORE BREAKFAST   No facility-administered encounter medications on file as of 12/26/2020.   Past Surgical History:  Procedure Laterality Date   CESAREAN SECTION     x1   ECTOPIC PREGNANCY SURGERY      Vitals:   12/26/20 1617  BP: 118/84  Pulse: 78  SpO2: 97%   Vitals:   12/26/20 1617  Weight: (!) 353 lb (160.1 kg)  Height: 5\' 7"  (1.702 m)   Body mass index is 55.29 kg/m.  No results found.   Independent interpretation of notes and tests performed by another provider:   None  Procedures performed:   Procedure:  Injection of left knee intra-articular under ultrasound guidance. Ultrasound guidance utilized for suprapatellar injection Samsung HS60 device utilized with permanent recording / reporting. Consent obtained and verified. Skin prepped in a sterile fashion. Ethyl chloride spray for topical local analgesia.  Completed without difficulty and tolerated well. Medication: Monovisc 88 mg (4  mL) Advised to contact for fevers/chills, erythema, induration, drainage, or persistent bleeding.   Pertinent History, Exam, Impression, and Recommendations:   Primary osteoarthritis of left knee Patient tolerated left knee Monovisc injection today, post care reviewed, injection can be repeated in 6 months time.  I did advise patient to finish out remaining physical therapy, transition to as needed dosing of diclofenac, and follow-up as needed.    Orders & Medications No orders of the defined types were placed in this encounter.  Orders Placed This Encounter  Procedures   12/28/20 LIMITED JOINT SPACE STRUCTURES LOW LEFT     Return if symptoms worsen or fail to improve.     , MD   Primary Care Sports Medicine [facility]

## 2020-12-26 NOTE — Patient Instructions (Signed)
-   Contact MRI scheduling for left knee, this should be performed at the November-December 2022 timeframe - [id number] (press option 3 then option 2)  You have just been given a gel lubricant injection to reduce knee pain. You can resume normal activities but avoid anything strenuous that puts excess strain on the joint for 48 hours. Avoid activities such as jogging, soccer, tennis, heavy lifting, or standing on your feet for a long time. If you do have pain, simply rest the joint and use ice. If you can tolerate over the counter medications, you can try Tylenol, Aleve, or Advil for added relief per package instructions. - The gel can be repeated in 6 months, contact our office to schedule or for any questions

## 2020-12-26 NOTE — Assessment & Plan Note (Signed)
Patient tolerated left knee Monovisc injection today, post care reviewed, injection can be repeated in 6 months time.  I did advise patient to finish out remaining physical therapy, transition to as needed dosing of diclofenac, and follow-up as needed.

## 2020-12-28 ENCOUNTER — Other Ambulatory Visit: Payer: Self-pay | Admitting: Family Medicine

## 2020-12-28 DIAGNOSIS — M25562 Pain in left knee: Secondary | ICD-10-CM

## 2020-12-28 NOTE — Telephone Encounter (Signed)
Requested medication (s) are due for refill today: yes  Requested medication (s) are on the active medication list: yes  Last refill:  10/31/20 #60 1 RF  Future visit scheduled: yes  Notes to clinic:  overdue lab work   Requested Prescriptions  Pending Prescriptions Disp Refills   diclofenac (VOLTAREN) 75 MG EC tablet [Pharmacy Med Name: DICLOFENAC SOD EC 75 MG TAB] 60 tablet 1    Sig: TAKE 1 TABLET BY MOUTH TWICE A DAY AS NEEDED     Analgesics:  NSAIDS Failed - 12/28/2020  4:38 PM      Failed - Cr in normal range and within 360 days    No results found for: CREATININE, LABCREAU, LABCREA, POCCRE        Failed - HGB in normal range and within 360 days    No results found for: HGB, HGBKUC, HGBPOCKUC, HGBOTHER, TOTHGB, HGBPLASMA        Passed - Patient is not pregnant      Passed - Valid encounter within last 12 months    Recent Outpatient Visits           2 days ago Primary osteoarthritis of left knee   [facility] Jerrol Banana, MD   2 months ago Primary osteoarthritis of left knee   [facility] Jerrol Banana, MD   5 months ago Hypothyroidism due to Hashimoto's thyroiditis   [facility] Duanne Limerick, MD   6 months ago Acute pain of left knee   [facility] Duanne Limerick, MD       Future Appointments             In 4 months Duanne Limerick, MD [facility]

## 2020-12-30 NOTE — Telephone Encounter (Signed)
Refill if appropriate.  Please advise. Originally prescribed by Dr. Yetta Barre.

## 2020-12-31 ENCOUNTER — Encounter: Payer: BC Managed Care – PPO | Admitting: Physical Therapy

## 2021-01-02 ENCOUNTER — Encounter: Payer: BC Managed Care – PPO | Admitting: Physical Therapy

## 2021-01-02 ENCOUNTER — Encounter: Payer: Self-pay | Admitting: Physical Therapy

## 2021-01-02 ENCOUNTER — Other Ambulatory Visit: Payer: Self-pay

## 2021-01-02 ENCOUNTER — Ambulatory Visit: Payer: BC Managed Care – PPO | Attending: Family Medicine | Admitting: Physical Therapy

## 2021-01-02 DIAGNOSIS — G8929 Other chronic pain: Secondary | ICD-10-CM

## 2021-01-02 DIAGNOSIS — M25562 Pain in left knee: Secondary | ICD-10-CM | POA: Diagnosis not present

## 2021-01-02 DIAGNOSIS — M25662 Stiffness of left knee, not elsewhere classified: Secondary | ICD-10-CM

## 2021-01-02 DIAGNOSIS — R2681 Unsteadiness on feet: Secondary | ICD-10-CM

## 2021-01-02 DIAGNOSIS — R2689 Other abnormalities of gait and mobility: Secondary | ICD-10-CM

## 2021-01-02 NOTE — Therapy (Signed)
[facility] [address] [place], [place], [address] Phone: [phone]   Fax:  [phone]  Physical Therapy Treatment and Discharge [place] 11/07/2020-01/02/2021  Patient Details  Name: Teresa Koch MRN: [id number] Date of Birth: 1980/08/23 Referring Provider (PT): Rosette Reveal MD   Encounter Date: 01/02/2021   PT End of Session - 01/02/21 1047     Visit Number 11    Number of Visits 12    Date for PT Re-Evaluation 01/09/21    Authorization - Visit Number 1    Authorization - Number of Visits 10    Progress Note Due on Visit 10    PT Start Time 1033    PT Stop Time 0263    PT Time Calculation (min) 14 min    Activity Tolerance Patient tolerated treatment well;No increased pain    Behavior During Therapy WFL for tasks assessed/performed             Past Medical History:  Diagnosis Date   Thyroid disease    hashimotos    Past Surgical History:  Procedure Laterality Date   CESAREAN SECTION     x1   ECTOPIC PREGNANCY SURGERY      There were no vitals filed for this visit.   Subjective Assessment - 01/02/21 1035     Subjective Pt present to tx without knee pain and 100% return to PLOF. Pt states mild pain post injection 12/26/20 that has reduced to 0/10 within a couple days. Pt states no pain at the start of tx and was able to complete her work trip without knee pain.    Pertinent History x-ray dated 06/17/2020 reveals moderate to severe medial tibiofemoral osteoarthritis, subtle angulation of the proximal fibula. MRI 08/02/2020: Medial meniscus of L knee, Lesion of L Promixal fibula, L knee OA. Pt works from home and is required to travel often for her job 2x months. Her kids are into sports and enjoys going to the games and shopping. Currently not physical activity.    Limitations Sitting;House hold activities    How long can you sit comfortably? 2 hours    How long can you stand comfortably? WFL    How long can you  walk comfortably? WFL    Diagnostic tests x-ray and MRI: OA, Subtle angulation of proximal medial meniscus tear of L knee    Patient Stated Goals Management of pain, and amb up and down stairs in the home without pain and UE assist.    Currently in Pain? No/denies    Pain Score 0-No pain               Reassessment:   NPS: 0/10  FOTO: 86 (achieved 12/24/2020)  Distal Hamstring WNL bilat (achieved 12/24/2020)   SLS: 30 holds bilat without pain   HEP/pain post injection.   Pt was able to complete injection with 0/10 pain within the first few days. Pt states independence with HEP without questions at this time.         PT Education - 01/02/21 1047     Education Details Pt was educate on the importance of continuing HEP for the maintenance of therapeutic gains.    Person(s) Educated Patient    Methods Explanation    Comprehension Verbalized understanding                 PT Long Term Goals - 01/02/21 1039       PT LONG TERM GOAL #1   Title Pt  will decrease L knee pain as reported on NPRS by at least 2 points in order to demonstrate clinically significant reduction in back pain.    Baseline 5/10 at worst 9/27: 1/10    Time 6    Period Weeks    Status Achieved    Target Date 12/24/20      PT LONG TERM GOAL #2   Title Pt will improve foto score to 69 to display reported return to optimal funtion and independence with home and community related ADLs    Baseline 52/56 on initial evaluation 9/27: 86 (goal achieved on 10th visit)    Time 6    Period Weeks    Status Achieved    Target Date 12/24/20      PT LONG TERM GOAL #3   Title Pt will improve L knee ext ROM in supine and hamstring mobilty equal to R side with pain less than 2/10 to promote a normalized gait pattern.    Baseline supine ROM lacking 5 deg. L hamstring deficits lacking 20 deg compared to R 9/27: distal hamstring WNL: bilat 0 deg ext.    Time 6    Period Weeks    Status Achieved    Target  Date 12/24/20      PT LONG TERM GOAL #4   Title Pt will improve SLS to 30 second bilat without increase in pain and UE assit to promote greater bilat knee stability for decreased risk of further injury.    Baseline R:30 sec L: 30ec. Pt display more difficulty with SLS on left compared to right with more aberrant movement.    Time 6    Period Weeks    Status Achieved    Target Date 01/02/21      PT LONG TERM GOAL #5   Title Pt will indepent HEP and return to maximal functional mobility after injection with worse pain 3/10 or less.    Baseline Pt is waiting to schedule injection this week. 10/6: Pt was able to complete injection with 0/10 pain within the first few days. Pt states independence with HEP without questions at this time.    Time 2    Period Weeks    Status Achieved    Target Date 01/02/21                   Plan - 01/02/21 1049     Clinical Impression Statement Pt was reassessed and d/c on this date 01/02/2021. Pt has met 5/5 goals with excellent prognosis for maintenance of therapeutic gains made during tx. Pt's HEP remained the same 2/2 busy work/life status. Pt was educated on the importance of return for screen/eval if sx display dramatic regression.    Personal Factors and Comorbidities Time since onset of injury/illness/exacerbation;Fitness;Profession    Examination-Activity Limitations Sit;Sleep;Bend;Lift;Squat;Stairs    Examination-Participation Restrictions Occupation;Shop    Stability/Clinical Decision Making Evolving/Moderate complexity    Clinical Decision Making Moderate    Rehab Potential Good    PT Frequency 1x / week    PT Duration 2 weeks    PT Treatment/Interventions ADLs/Self Care Home Management;Aquatic Therapy;Biofeedback;Cryotherapy;Electrical Stimulation;Moist Heat;Traction;Gait training;Stair training;Functional mobility training;Therapeutic activities;Neuromuscular re-education;Balance training;Therapeutic exercise;Patient/family  education;Manual techniques;Passive range of motion;Dry needling;Spinal Manipulations;Joint Manipulations;Taping    PT Next Visit Plan pt d/c with good prognosis    PT Home Exercise Plan [id number]             Patient will benefit from skilled therapeutic intervention in order to improve the following deficits and  impairments:  Abnormal gait, Decreased activity tolerance, Decreased balance, Decreased endurance, Decreased mobility, Decreased range of motion, Difficulty walking, Hypomobility, Increased edema, Impaired flexibility, Pain, Improper body mechanics, Obesity  Visit Diagnosis: Chronic pain of left knee  Generally unsteady  Antalgic gait  Decreased ROM of left knee     Problem List Patient Active Problem List   Diagnosis Date Noted   Primary osteoarthritis of left knee 10/29/2020   Tear of medial meniscus of right knee, current 10/29/2020   Lesion of bone of knee 10/29/2020   Pura Spice, PT, DPT # [id number] Fara Olden, SPT 01/02/2021, 6:53 PM  [facility] [address] [place], [place], [address] Phone: [phone]   Fax:  [phone]  Name: Teresa Koch MRN: [id number] Date of Birth: [birth year]

## 2021-01-09 ENCOUNTER — Ambulatory Visit: Payer: BC Managed Care – PPO | Admitting: Physical Therapy

## 2021-03-10 ENCOUNTER — Other Ambulatory Visit: Payer: Self-pay | Admitting: Family Medicine

## 2021-03-10 DIAGNOSIS — M25562 Pain in left knee: Secondary | ICD-10-CM

## 2021-03-10 NOTE — Telephone Encounter (Signed)
Requested Prescriptions  Pending Prescriptions Disp Refills  . diclofenac (VOLTAREN) 75 MG EC tablet [Pharmacy Med Name: DICLOFENAC SOD EC 75 MG TAB] 60 tablet 1    Sig: TAKE 1 TABLET BY MOUTH TWICE A DAY AS NEEDED     Analgesics:  NSAIDS Failed - 03/10/2021  1:35 AM      Failed - Cr in normal range and within 360 days    No results found for: CREATININE, LABCREAU, LABCREA, POCCRE       Failed - HGB in normal range and within 360 days    No results found for: HGB, HGBKUC, HGBPOCKUC, HGBOTHER, TOTHGB, HGBPLASMA       Passed - Patient is not pregnant      Passed - Valid encounter within last 12 months    Recent Outpatient Visits          2 months ago Primary osteoarthritis of left knee   [facility] Jerrol Banana, MD   4 months ago Primary osteoarthritis of left knee   [facility] Jerrol Banana, MD   8 months ago Hypothyroidism due to Hashimoto's thyroiditis   [facility] Duanne Limerick, MD   8 months ago Acute pain of left knee   [facility] Duanne Limerick, MD      Future Appointments            In 1 month Duanne Limerick, MD [facility], [facility]

## 2021-04-19 ENCOUNTER — Other Ambulatory Visit: Payer: Self-pay | Admitting: Family Medicine

## 2021-04-19 DIAGNOSIS — E038 Other specified hypothyroidism: Secondary | ICD-10-CM

## 2021-04-19 NOTE — Telephone Encounter (Signed)
Called pt and she stated she would like to stay with Rene Kocher at [facility]  Requested Prescriptions  Pending Prescriptions Disp Refills   levothyroxine (SYNTHROID) 175 MCG tablet [Pharmacy Med Name: LEVOTHYROXINE 175 MCG TABLET] 90 tablet 1    Sig: TAKE 1 TABLET BY MOUTH EVERY DAY BEFORE BREAKFAST     Endocrinology:  Hypothyroid Agents Failed - 04/19/2021 10:12 AM      Failed - TSH needs to be rechecked within 3 months after an abnormal result. Refill until TSH is due.      Passed - TSH in normal range and within 360 days    TSH  Date Value Ref Range Status  10/29/2020 1.910 0.450 - 4.500 uIU/mL Final         Passed - Valid encounter within last 12 months    Recent Outpatient Visits          3 months ago Primary osteoarthritis of left knee   [facility] Jerrol Banana, MD   5 months ago Primary osteoarthritis of left knee   [facility] Jerrol Banana, MD   9 months ago Hypothyroidism due to Hashimoto's thyroiditis   [facility] Duanne Limerick, MD   10 months ago Acute pain of left knee   [facility] Duanne Limerick, MD      Future Appointments            In 2 weeks Duanne Limerick, MD [facility], [facility]

## 2021-05-05 ENCOUNTER — Ambulatory Visit: Payer: Self-pay | Admitting: Family Medicine

## 2021-05-09 ENCOUNTER — Ambulatory Visit (INDEPENDENT_AMBULATORY_CARE_PROVIDER_SITE_OTHER): Payer: Self-pay | Admitting: Family Medicine

## 2021-05-09 ENCOUNTER — Encounter: Payer: Self-pay | Admitting: Family Medicine

## 2021-05-09 ENCOUNTER — Other Ambulatory Visit: Payer: Self-pay

## 2021-05-09 VITALS — BP 122/80 | HR 68 | Ht 67.0 in | Wt 342.0 lb

## 2021-05-09 DIAGNOSIS — E785 Hyperlipidemia, unspecified: Secondary | ICD-10-CM | POA: Diagnosis not present

## 2021-05-09 DIAGNOSIS — E038 Other specified hypothyroidism: Secondary | ICD-10-CM

## 2021-05-09 DIAGNOSIS — E063 Autoimmune thyroiditis: Secondary | ICD-10-CM

## 2021-05-09 MED ORDER — LEVOTHYROXINE SODIUM 175 MCG PO TABS: ORAL_TABLET | ORAL | 1 refills | Status: DC

## 2021-05-09 NOTE — Progress Notes (Signed)
Date:  05/09/2021   Name:  Teresa Koch   DOB:  [birth year]   MRN:  [id number]   Chief Complaint: Hypothyroidism  Thyroid Problem Presents for follow-up visit. Patient reports no anxiety, cold intolerance, constipation, depressed mood, diaphoresis, diarrhea, dry skin, fatigue, hair loss, heat intolerance, hoarse voice, leg swelling, menstrual problem, nail problem, palpitations, tremors, visual change, weight gain or weight loss. The symptoms have been stable.   No results found for: NA, K, CO2, GLUCOSE, BUN, CREATININE, CALCIUM, EGFR, GFRNONAA, GLUCOSE No results found for: CHOL, HDL, LDLCALC, LDLDIRECT, TRIG, CHOLHDL Lab Results  Component Value Date   TSH 1.910 10/29/2020   No results found for: HGBA1C No results found for: WBC, HGB, HCT, MCV, PLT No results found for: ALT, AST, GGT, ALKPHOS, BILITOT No results found for: 25OHVITD2, 25OHVITD3, VD25OH   Review of Systems  Constitutional:  Negative for chills, diaphoresis, fatigue, fever, weight gain and weight loss.  HENT:  Negative for drooling, ear discharge, ear pain, hoarse voice and sore throat.   Respiratory:  Negative for cough, shortness of breath and wheezing.   Cardiovascular:  Negative for chest pain, palpitations and leg swelling.  Gastrointestinal:  Negative for abdominal pain, blood in stool, constipation, diarrhea and nausea.  Endocrine: Negative for cold intolerance, heat intolerance and polydipsia.  Genitourinary:  Negative for dysuria, frequency, hematuria, menstrual problem and urgency.  Musculoskeletal:  Negative for back pain, myalgias and neck pain.  Skin:  Negative for rash.  Allergic/Immunologic: Negative for environmental allergies.  Neurological:  Negative for dizziness, tremors and headaches.  Hematological:  Does not bruise/bleed easily.  Psychiatric/Behavioral:  Negative for suicidal ideas. The patient is not nervous/anxious.    Patient Active Problem List   Diagnosis Date Noted   Primary  osteoarthritis of left knee 10/29/2020   Tear of medial meniscus of right knee, current 10/29/2020   Lesion of bone of knee 10/29/2020    Allergies  Allergen Reactions   Oxycodone-Acetaminophen     Other reaction(s): Other Hallucinations and paranoia   Other     Other reaction(s): Other (See Comments) Percocet- Hallucinations    Penicillins Nausea And Vomiting, Other (See Comments) and Diarrhea    Migraines      Past Surgical History:  Procedure Laterality Date   CESAREAN SECTION     x1   ECTOPIC PREGNANCY SURGERY      Social History   Tobacco Use   Smoking status: Former    Years: 15.00    Types: Cigarettes    Quit date: 03/31/2015    Years since quitting: 6.1   Smokeless tobacco: Never  Vaping Use   Vaping Use: Never used  Substance Use Topics   Alcohol use: Yes    Comment: once a week   Drug use: Never     Medication list has been reviewed and updated.  Current Meds  Medication Sig   diclofenac (VOLTAREN) 75 MG EC tablet TAKE 1 TABLET BY MOUTH TWICE A DAY AS NEEDED   levothyroxine (SYNTHROID) 175 MCG tablet TAKE 1 TABLET BY MOUTH EVERY DAY BEFORE BREAKFAST    PHQ 2/9 Scores 12/26/2020 10/29/2020 06/17/2020  PHQ - 2 Score 0 0 0  PHQ- 9 Score 0 1 1    GAD 7 : Generalized Anxiety Score 12/26/2020 10/29/2020 06/17/2020  Nervous, Anxious, on Edge 0 1 0  Control/stop worrying 0 0 0  Worry too much - different things 0 0 0  Trouble relaxing 0 0 0  Restless 0 0 0  Easily annoyed or irritable 0 0 0  Afraid - awful might happen 0 0 0  Total GAD 7 Score 0 1 0  Anxiety Difficulty Not difficult at all Not difficult at all -    BP Readings from Last 3 Encounters:  05/09/21 122/80  12/26/20 118/84  10/29/20 122/84    Physical Exam Vitals and nursing note reviewed.  Constitutional:      Appearance: She is well-developed.  HENT:     Head: Normocephalic.     Right Ear: External ear normal.     Left Ear: External ear normal.  Eyes:     General: Lids are  everted, no foreign bodies appreciated. No scleral icterus.       Left eye: No foreign body or hordeolum.     Conjunctiva/sclera: Conjunctivae normal.     Right eye: Right conjunctiva is not injected.     Left eye: Left conjunctiva is not injected.     Pupils: Pupils are equal, round, and reactive to light.  Neck:     Thyroid: No thyromegaly.     Vascular: No JVD.     Trachea: No tracheal deviation.  Cardiovascular:     Rate and Rhythm: Normal rate and regular rhythm.     Heart sounds: Normal heart sounds, S1 normal and S2 normal. No murmur heard. No systolic murmur is present.  No diastolic murmur is present.    No friction rub. No gallop. No S3 or S4 sounds.  Pulmonary:     Effort: Pulmonary effort is normal. No respiratory distress.     Breath sounds: Normal breath sounds. No wheezing or rales.  Abdominal:     General: Bowel sounds are normal.     Palpations: Abdomen is soft. There is no hepatomegaly, splenomegaly or mass.     Tenderness: There is no abdominal tenderness. There is no guarding or rebound.  Musculoskeletal:        General: No tenderness. Normal range of motion.     Cervical back: Normal range of motion and neck supple.  Lymphadenopathy:     Cervical: No cervical adenopathy.  Skin:    General: Skin is warm.     Findings: No rash.  Neurological:     Mental Status: She is alert and oriented to person, place, and time.     Cranial Nerves: No cranial nerve deficit.     Deep Tendon Reflexes: Reflexes normal.  Psychiatric:        Mood and Affect: Mood is not anxious or depressed.    Wt Readings from Last 3 Encounters:  05/09/21 (!) 342 lb (155.1 kg)  12/26/20 (!) 353 lb (160.1 kg)  10/29/20 (!) 351 lb (159.2 kg)    BP 122/80    Pulse 68    Ht _0  (1.702 m)    Wt (!) 342 lb (155.1 kg)    LMP 05/02/2021 (Approximate)    BMI 53.56 kg/m   Assessment and Plan:  1. Hypothyroidism due to Hashimoto's thyroiditis Chronic.  Controlled.  Stable.  Upon checking  thyroid panel with TSH pending level of TSH we will likely continue levothyroxine 175 mg once a day we will recheck patient in 6 months. - levothyroxine (SYNTHROID) 175 MCG tablet; TAKE 1 TABLET BY MOUTH EVERY DAY BEFORE BREAKFAST  Dispense: 90 tablet; Refill: 1 - Thyroid Panel With TSH - Renal Function Panel  2. Hyperlipidemia, unspecified hyperlipidemia type Chronic.  Noted in past that lipids were elevated but I am uncertain if they were fasting.  We will check renal function and lipid panel for current level of lipid management - Lipid Panel With LDL/HDL Ratio - Renal Function Panel

## 2021-05-10 LAB — RENAL FUNCTION PANEL
Albumin: 4.5 g/dL (ref 3.8–4.8)
BUN/Creatinine Ratio: 11 (ref 9–23)
BUN: 10 mg/dL (ref 6–24)
CO2: 20 mmol/L (ref 20–29)
Calcium: 9.8 mg/dL (ref 8.7–10.2)
Chloride: 100 mmol/L (ref 96–106)
Creatinine, Ser: 0.88 mg/dL (ref 0.57–1.00)
Glucose: 179 mg/dL — ABNORMAL HIGH (ref 70–99)
Phosphorus: 3.4 mg/dL (ref 3.0–4.3)
Potassium: 4.7 mmol/L (ref 3.5–5.2)
Sodium: 139 mmol/L (ref 134–144)
eGFR: 85 mL/min/{1.73_m2} (ref >59)

## 2021-05-10 LAB — LIPID PANEL WITH LDL/HDL RATIO
Cholesterol, Total: 284 mg/dL — ABNORMAL HIGH (ref 100–199)
HDL: 34 mg/dL — ABNORMAL LOW (ref >39)
LDL Chol Calc (NIH): 176 mg/dL — ABNORMAL HIGH (ref 0–99)
LDL/HDL Ratio: 5.2 ratio — ABNORMAL HIGH (ref 0.0–3.2)
Triglycerides: 374 mg/dL — ABNORMAL HIGH (ref 0–149)
VLDL Cholesterol Cal: 74 mg/dL — ABNORMAL HIGH (ref 5–40)

## 2021-05-10 LAB — THYROID PANEL WITH TSH
Free Thyroxine Index: 2.9 (ref 1.2–4.9)
T3 Uptake Ratio: 31 % (ref 24–39)
T4, Total: 9.5 ug/dL (ref 4.5–12.0)
TSH: 2.78 u[IU]/mL (ref 0.450–4.500)

## 2021-05-16 ENCOUNTER — Other Ambulatory Visit: Payer: Self-pay | Admitting: Family Medicine

## 2021-05-16 DIAGNOSIS — M25562 Pain in left knee: Secondary | ICD-10-CM

## 2021-05-16 NOTE — Telephone Encounter (Signed)
Requested medication (s) are due for refill today: yes  Requested medication (s) are on the active medication list: yes  Last refill:  03/10/21 #60 with 1 RF  Future visit scheduled: 11/07/21  Notes to clinic:  Failed protocol of labs within 360 days, (Hgb, HCT,Platelets not on record)  has upcoming appt, please assess.    Requested Prescriptions  Pending Prescriptions Disp Refills   diclofenac (VOLTAREN) 75 MG EC tablet [Pharmacy Med Name: DICLOFENAC SOD EC 75 MG TAB] 60 tablet 1    Sig: TAKE 1 TABLET BY MOUTH TWICE A DAY AS NEEDED     Analgesics:  NSAIDS Failed - 05/16/2021  2:10 AM      Failed - Manual Review: Labs are only required if the patient has taken medication for more than 8 weeks.      Failed - HGB in normal range and within 360 days    No results found for: HGB, HGBKUC, HGBPOCKUC, HGBOTHER, TOTHGB, HGBPLASMA        Failed - PLT in normal range and within 360 days    No results found for: PLT, PLTCOUNTKUC, LABPLAT, POCPLA        Failed - HCT in normal range and within 360 days    No results found for: HCT, HCTKUC, SRHCT        Passed - Cr in normal range and within 360 days    Creatinine, Ser  Date Value Ref Range Status  05/09/2021 0.88 0.57 - 1.00 mg/dL Final          Passed - eGFR is 30 or above and within 360 days    eGFR  Date Value Ref Range Status  05/09/2021 85 >59 mL/min/1.73 Final          Passed - Patient is not pregnant      Passed - Valid encounter within last 12 months    Recent Outpatient Visits           1 week ago Hypothyroidism due to Hashimoto's thyroiditis   [place], MD   4 months ago Primary osteoarthritis of left knee   [facility] Montel Culver, MD   6 months ago Primary osteoarthritis of left knee   [facility] Montel Culver, MD   10 months ago Hypothyroidism due to Hashimoto's thyroiditis   [place], MD   11 months ago Acute pain of  left knee   [facility] Juline Patch, MD       Future Appointments             In 5 months Juline Patch, MD [facility], [facility]

## 2021-06-05 ENCOUNTER — Other Ambulatory Visit: Payer: Self-pay

## 2021-06-05 DIAGNOSIS — R739 Hyperglycemia, unspecified: Secondary | ICD-10-CM | POA: Diagnosis not present

## 2021-06-06 ENCOUNTER — Encounter: Payer: Self-pay | Admitting: Family Medicine

## 2021-06-06 ENCOUNTER — Other Ambulatory Visit: Payer: Self-pay

## 2021-06-06 DIAGNOSIS — M899 Disorder of bone, unspecified: Secondary | ICD-10-CM

## 2021-06-06 LAB — HEMOGLOBIN A1C
Est. average glucose Bld gHb Est-mCnc: 186 mg/dL
Hgb A1c MFr Bld: 8.1 % — ABNORMAL HIGH (ref 4.8–5.6)

## 2021-06-06 NOTE — Progress Notes (Signed)
Placed order MRI ?

## 2021-06-09 ENCOUNTER — Encounter: Payer: Self-pay | Admitting: Family Medicine

## 2021-06-09 ENCOUNTER — Other Ambulatory Visit: Payer: Self-pay

## 2021-06-09 ENCOUNTER — Ambulatory Visit (INDEPENDENT_AMBULATORY_CARE_PROVIDER_SITE_OTHER): Payer: Self-pay | Admitting: Family Medicine

## 2021-06-09 VITALS — BP 130/80 | HR 68 | Ht 67.0 in | Wt 340.0 lb

## 2021-06-09 DIAGNOSIS — Z6841 Body Mass Index (BMI) 40.0 and over, adult: Secondary | ICD-10-CM

## 2021-06-09 DIAGNOSIS — G4733 Obstructive sleep apnea (adult) (pediatric): Secondary | ICD-10-CM

## 2021-06-09 DIAGNOSIS — E119 Type 2 diabetes mellitus without complications: Secondary | ICD-10-CM

## 2021-06-09 DIAGNOSIS — E785 Hyperlipidemia, unspecified: Secondary | ICD-10-CM

## 2021-06-09 MED ORDER — METFORMIN HCL ER 500 MG PO TB24: 500.0000 mg | ORAL_TABLET | Freq: Every day | ORAL | 1 refills | Status: DC

## 2021-06-09 NOTE — Patient Instructions (Addendum)
Diabetes Mellitus and Nutrition, Adult °When you have diabetes, or diabetes mellitus, it is very important to have healthy eating habits because your blood sugar (glucose) levels are greatly affected by what you eat and drink. Eating healthy foods in the right amounts, at about the same times every day, can help you: °Manage your blood glucose. °Lower your risk of heart disease. °Improve your blood pressure. °Reach or maintain a healthy weight. °What can affect my meal plan? °Every person with diabetes is different, and each person has different needs for a meal plan. Your health care provider may recommend that you work with a dietitian to make a meal plan that is best for you. Your meal plan may vary depending on factors such as: °The calories you need. °The medicines you take. °Your weight. °Your blood glucose, blood pressure, and cholesterol levels. °Your activity level. °Other health conditions you have, such as heart or kidney disease. °How do carbohydrates affect me? °Carbohydrates, also called carbs, affect your blood glucose level more than any other type of food. Eating carbs raises the amount of glucose in your blood. °It is important to know how many carbs you can safely have in each meal. This is different for every person. Your dietitian can help you calculate how many carbs you should have at each meal and for each snack. °How does alcohol affect me? °Alcohol can cause a decrease in blood glucose (hypoglycemia), especially if you use insulin or take certain diabetes medicines by mouth. Hypoglycemia can be a life-threatening condition. Symptoms of hypoglycemia, such as sleepiness, dizziness, and confusion, are similar to symptoms of having too much alcohol. °Do not drink alcohol if: °Your health care provider tells you not to drink. °You are pregnant, may be pregnant, or are planning to become pregnant. °If you drink alcohol: °Limit how much you have to: °0-1 drink a day for women. °0-2 drinks a day  for men. °Know how much alcohol is in your drink. In the [place] one drink equals one 12 oz bottle of beer (355 mL), one 5 oz glass of wine (148 mL), or one 1½ oz glass of hard liquor (44 mL). °Keep yourself hydrated with water, diet soda, or unsweetened iced tea. Keep in mind that regular soda, juice, and other mixers may contain a lot of sugar and must be counted as carbs. °What are tips for following this plan? °Reading food labels °Start by checking the serving size on the Nutrition Facts label of packaged foods and drinks. The number of calories and the amount of carbs, fats, and other nutrients listed on the label are based on one serving of the item. Many items contain more than one serving per package. °Check the total grams (g) of carbs in one serving. °Check the number of grams of saturated fats and trans fats in one serving. Choose foods that have a low amount or none of these fats. °Check the number of milligrams (mg) of salt (sodium) in one serving. Most people should limit total sodium intake to less than 2,300 mg per day. °Always check the nutrition information of foods labeled as "low-fat" or "nonfat." These foods may be higher in added sugar or refined carbs and should be avoided. °Talk to your dietitian to identify your daily goals for nutrients listed on the label. °Shopping °Avoid buying canned, pre-made, or processed foods. These foods tend to be high in fat, sodium, and added sugar. °Shop around the outside edge of the grocery store. This is where you will   most often find fresh fruits and vegetables, bulk grains, fresh meats, and fresh dairy products. Cooking Use low-heat cooking methods, such as baking, instead of high-heat cooking methods, such as deep frying. Cook using healthy oils, such as olive, canola, or sunflower oil. Avoid cooking with butter, cream, or high-fat meats. Meal planning Eat meals and snacks regularly, preferably at the same times every day. Avoid going long periods of  time without eating. Eat foods that are high in fiber, such as fresh fruits, vegetables, beans, and whole grains. Eat 4-6 oz (112-168 g) of lean protein each day, such as lean meat, chicken, fish, eggs, or tofu. One ounce (oz) (28 g) of lean protein is equal to: 1 oz (28 g) of meat, chicken, or fish. 1 egg.  cup (62 g) of tofu. Eat some foods each day that contain healthy fats, such as avocado, nuts, seeds, and fish. What foods should I eat? Fruits Berries. Apples. Oranges. Peaches. Apricots. Plums. Grapes. Mangoes. Papayas. Pomegranates. Kiwi. Cherries. Vegetables Leafy greens, including lettuce, spinach, kale, chard, collard greens, mustard greens, and cabbage. Beets. Cauliflower. Broccoli. Carrots. Green beans. Tomatoes. Peppers. Onions. Cucumbers. Brussels sprouts. Grains Whole grains, such as whole-wheat or whole-grain bread, crackers, tortillas, cereal, and pasta. Unsweetened oatmeal. Quinoa. Brown or wild rice. Meats and other proteins Seafood. Poultry without skin. Lean cuts of poultry and beef. Tofu. Nuts. Seeds. Dairy Low-fat or fat-free dairy products such as milk, yogurt, and cheese. The items listed above may not be a complete list of foods and beverages you can eat and drink. Contact a dietitian for more information. What foods should I avoid? Fruits Fruits canned with syrup. Vegetables Canned vegetables. Frozen vegetables with butter or cream sauce. Grains Refined white flour and flour products such as bread, pasta, snack foods, and cereals. Avoid all processed foods. Meats and other proteins Fatty cuts of meat. Poultry with skin. Breaded or fried meats. Processed meat. Avoid saturated fats. Dairy Full-fat yogurt, cheese, or milk. Beverages Sweetened drinks, such as soda or iced tea. The items listed above may not be a complete list of foods and beverages you should avoid. Contact a dietitian for more information. Questions to ask a health care provider Do I need to  meet with a certified diabetes care and education specialist? Do I need to meet with a dietitian? What number can I call if I have questions? When are the best times to check my blood glucose? Where to find more information: American Diabetes Association: diabetes.org Academy of Nutrition and Dietetics: eatright.[employer] of Diabetes and Digestive and Kidney Diseases: [email or web address].si Association of Diabetes Care & Education Specialists: diabeteseducator.org Summary It is important to have healthy eating habits because your blood sugar (glucose) levels are greatly affected by what you eat and drink. It is important to use alcohol carefully. A healthy meal plan will help you manage your blood glucose and lower your risk of heart disease. Your health care provider may recommend that you work with a dietitian to make a meal plan that is best for you. This information is not intended to replace advice given to you by your health care provider. Make sure you discuss any questions you have with your health care provider. Document Revised: 10/18/2019 Document Reviewed: 10/18/2019 Elsevier Patient Education  2022 Elsevier Inc. GUIDELINES FOR  LOW-CHOLESTEROL, LOW-TRIGLYCERIDE DIETS    FOODS TO USE   MEATS, FISH Choose lean meats (chicken, [place], veal, and non-fatty cuts of beef with excess fat trimmed; one serving = 3 oz of  cooked meat). Also, fresh or frozen fish, canned fish packed in water, and shellfish (lobster, crabs, shrimp, and oysters). Limit use to no more than one serving of one of these per week. Shellfish are high in cholesterol but low in saturated fat and should be used sparingly. Meats and fish should be broiled (pan or oven) or baked on a rack.  EGGS Egg substitutes and egg whites (use freely). Egg yolks (limit two per week).  FRUITS Eat three servings of fresh fruit per day (1 serving =  cup). Be sure to have at least one citrus fruit daily. Frozen and canned fruit with  no sugar or syrup added may be used.  VEGETABLES Most vegetables are not limited (see next page). One dark-green (string beans, escarole) or one deep yellow (squash) vegetable is recommended daily. Cauliflower, broccoli, and celery, as well as potato skins, are recommended for their fiber content. (Fiber is associated with cholesterol reduction) It is preferable to steam vegetables, but they may be boiled, strained, or braised with polyunsaturated vegetable oil (see below).  BEANS Dried peas or beans (1 serving =  cup) may be used as a bread substitute.  NUTS Almonds, walnuts, and peanuts may be used sparingly  (1 serving = 1 Tablespoonful). Use pumpkin, sesame, or sunflower seeds.  BREADS, GRAINS One roll or one slice of whole grain or enriched bread may be used, or three soda crackers or four pieces of melba toast as a substitute. Spaghetti, rice or noodles ( cup) or  large ear of corn may be used as a bread substitute. In preparing these foods do not use butter or shortening, use soft margarine. Also use egg and sugar substitutes.  Choose high fiber grains, such as oats and whole wheat.  CEREALS Use  cup of hot cereal or  cup of cold cereal per day. Add a sugar substitute if desired, with 99% fat free or skim milk.  MILK PRODUCTS Always use 99% fat free or skim milk, dairy products such as low fat cheeses (farmer's uncreamed diet cottage), low-fat yogurt, and powdered skim milk.  FATS, OILS Use soft (not stick) margarine; vegetable oils that are high in polyunsaturated fats (such as safflower, sunflower, soybean, corn, and cottonseed). Always refrigerate meat drippings to harden the fat and remove it before preparing gravies  DESSERTS, SNACKS Limit to two servings per day; substitute each serving for a bread/cereal serving: ice milk, water sherbet (1/4 cup); unflavored gelatin or gelatin flavored with sugar substitute (1/3 cup); pudding prepared with skim milk (1/2 cup); egg white souffls;  unbuttered popcorn (1  cups). Substitute carob for chocolate.  BEVERAGES Fresh fruit juices (limit 4 oz per day); black coffee, plain or herbal teas; soft drinks with sugar substitutes; club soda, preferably salt-free; cocoa made with skim milk or nonfat dried milk and water (sugar substitute added if desired); clear broth. Alcohol: limit two servings per day (see second page).  MISCELLANEOUS  You may use the following freely: vinegar, spices, herbs, nonfat bouillon, mustard, Worcestershire sauce, soy sauce, flavoring essence.                  GUIDELINES FOR  LOW-CHOLESTEROL, LOW TRIGLYCERIDE DIETS    FOODS TO AVOID   MEATS, FISH Marbled beef, pork, bacon, sausage, and other pork products; fatty fowl (duck, goose); skin and fat of [place] and chicken; processed meats; luncheon meats (salami, bologna); frankfurters and fast-food hamburgers (theyre loaded with fat); organ meats (kidneys, liver); canned fish packed in oil.  EGGS Limit  egg yolks to two per week.   FRUITS Coconuts (rich in saturated fats).  VEGETABLES Avoid avocados. Starchy vegetables (potatoes, corn, lima beans, dried peas, beans) may be used only if substitutes for a serving of bread or cereal. (Baked potato skin, however, is desirable for its fiber content.  BEANS Commercial baked beans with sugar and/or pork added.  NUTS Avoid nuts.  Limit peanuts and walnuts to one tablespoonful per day.  BREADS, GRAINS Any baked goods with shortening and/or sugar. Commercial mixes with dried eggs and whole milk. Avoid sweet rolls, doughnuts, breakfast pastries (Danish), and sweetened packaged cereals (the added sugar converts readily to triglycerides).  MILK PRODUCTS Whole milk and whole-milk packaged goods; cream; ice cream; whole-milk puddings, yogurt, or cheeses; nondairy cream substitutes.  FATS, OILS Butter, lard, animal fats, bacon drippings, gravies, cream sauces as well as palm and coconut oils. All these are high in  saturated fats. Examine labels on cholesterol free products for hydrogenated fats. (These are oils that have been hardened into solids and in the process have become saturated.)  DESSERTS, SNACKS Fried snack foods like potato chips; chocolate; candies in general; jams, jellies, syrups; whole- milk puddings; ice cream and milk sherbets; hydrogenated peanut butter.  BEVERAGES Sugared fruit juices and soft drinks; cocoa made with whole milk and/or sugar. When using alcohol (1 oz liquor, 5 oz beer, or 2  oz dry table wine per serving), one serving must be substituted for one bread or cereal serving (limit, two servings of alcohol per day).   SPECIAL NOTES    Remember that even non-limited foods should be used in moderation. While on a cholesterol-lowering diet, be sure to avoid animal fats and marbled meats. 3. While on a triglyceride-lowering diet, be sure to avoid sweets and to control the amount of carbohydrates you eat (starchy foods such as flour, bread, potatoes).While on a tri-glyceride-lowering diet, be sure to avoid sweets Buy a good low-fat cookbook, such as the one published by the American Heart Association. Consult your physician if you have any questions.               [facility] Low Glycemic Diet Plan   Low Glycemic Foods (20-49) Moderate Glycemic Foods (50-69) High Glycemic Foods (70-100)      Breakfast Creals Breakfast Cereals Breakfast Cereals  All Bran All-Bran Fruit'n Oats   Bran Buds Bran Chex   Cheerios Corn chex    Fiber One Oatmeal (not instant)   Just Right Mini-Wheats   Corn Flakes Cream of Wheat    Oat Bran Special K Swiss Muesli   Grape Nuts Grape Nut Flakes      Grits Nutri-Grain    Fruits and fruit juice: Fruits Puffed Rice Puffed Wheat    (Limit to 1-2 Servings per day) Banana (under-ride) Dates   Rice Chex Rice Krispies    Apples Apricots (fresh/dried)   Figs Grapes   Shredded Wheat Team    Blackberries Blueberries   Kiwi Mango    Total     Cherries Cranberries   Oranges Raisins     Peaches Pears    Fruits  Plums Prunes   Fruit Juices Pineapple Watermelon    Grapefruit Raspberries   Cranberry Juice Orange Juice   Banana (over-ripe)     Strawberries Tangerines      Apple Juice Grapefruit Juice   Beans and Legumes Beverages  Tomato Juice    [place]-type baked beans Sodas, sweet tea, pineapple juice   Canned pinto, kidney, or navy  beans   Beans and Legumes (fresh-cooked) Green peas Vegetables  Black-eyed peas Butter Beans    Potato, baked, boiled, fried, mashed  Chick peas Lentils   Vegetables [place] fries  Green beans Lima beans   Beets Carrots   Canned or frozen corn  Kidney beans Navy beans   Sweet potato Yam   Parsnips  Pinto beans Snow peas   Corn on the cob Winter squash      Non-starchy vegetables Grains Breads  Asparagus, avocado, broccoli, cabbage Cornmeal Rice, brown   Most breads (white and whole grain)  cauliflower, celery, cucumber, greens Rice, white Couscous   Bagels Bread sticks    lettuce, mushrooms, peppers, tomatoes  Bread stuffing Kaiser roll    okra, onions, spinach, summer squash Pasta Dinner rolls   [employer], cheese     Grains Ravioli, meat filled Spaghetti, white   Grains  Barley Bulgur    Rice, instant Tapioca, with milk    Rye Wild rice   Nuts    Cashews Macadamia   Candy and most cookies  Nuts and oils    Almonds, peanuts, sunflower seeds Snacks Snacks  hazelnuts, pecans, walnuts Chocolate Ice cream, lowfat   Donuts Corn chips    Oils that are liquid at room temperature Muffin Popcorn   Jelly beans Pretzels      Pastries  Dairy, fish, meat, soy, and eggs    Milk, skim Lowfat cheese    Restaurant and ethnic foods  Yogurt, lowfat, fruit sugar sweetened  Most [place] food (sugar in stir fry    or wok sauce)  Lean red meat Fish    Teriyaki-style meats and vegetables  Skinless chicken and [place], shellfish        Egg whites (up to 3 daily), Soy  Products    Egg yolks (up to 7 or _____ per week)

## 2021-06-09 NOTE — Progress Notes (Signed)
? ? ?Date:  06/09/2021  ? ?Name:  Teresa Koch   DOB:  02-Feb-1981   MRN:  [id number] ? ? ?Chief Complaint: Diabetes ? ?Diabetes ?She presents for her follow-up diabetic visit. She has type 2 diabetes mellitus. Her disease course has been stable. There are no hypoglycemic associated symptoms. Pertinent negatives for hypoglycemia include no dizziness, headaches or nervousness/anxiousness. There are no diabetic associated symptoms. Pertinent negatives for diabetes include no blurred vision, no chest pain, no polydipsia, no polyphagia, no polyuria and no weight loss. There are no hypoglycemic complications. Symptoms are stable. There are no diabetic complications. She is following a generally healthy (weight watcher) diet. She participates in exercise intermittently. An ACE inhibitor/angiotensin II receptor blocker is being taken.  ? ?Lab Results  ?Component Value Date  ? NA 139 05/09/2021  ? K 4.7 05/09/2021  ? CO2 20 05/09/2021  ? GLUCOSE 179 (H) 05/09/2021  ? BUN 10 05/09/2021  ? CREATININE 0.88 05/09/2021  ? CALCIUM 9.8 05/09/2021  ? EGFR 85 05/09/2021  ? ?Lab Results  ?Component Value Date  ? CHOL 284 (H) 05/09/2021  ? HDL 34 (L) 05/09/2021  ? LDLCALC 176 (H) 05/09/2021  ? TRIG 374 (H) 05/09/2021  ? ?Lab Results  ?Component Value Date  ? TSH 2.780 05/09/2021  ? ?Lab Results  ?Component Value Date  ? HGBA1C 8.1 (H) 06/05/2021  ? ?No results found for: WBC, HGB, HCT, MCV, PLT ?No results found for: ALT, AST, GGT, ALKPHOS, BILITOT ?No results found for: 25OHVITD2, [place], VD25OH  ? ?Review of Systems  ?Constitutional:  Negative for chills, fever and weight loss.  ?HENT:  Negative for drooling, ear discharge, ear pain and sore throat.   ?Eyes:  Negative for blurred vision.  ?Respiratory:  Negative for cough, shortness of breath and wheezing.   ?Cardiovascular:  Negative for chest pain, palpitations and leg swelling.  ?Gastrointestinal:  Negative for abdominal pain, blood in stool, constipation, diarrhea and nausea.   ?Endocrine: Negative for polydipsia, polyphagia and polyuria.  ?Genitourinary:  Negative for dysuria, frequency, hematuria and urgency.  ?Musculoskeletal:  Negative for back pain, myalgias and neck pain.  ?Skin:  Negative for rash.  ?Allergic/Immunologic: Negative for environmental allergies.  ?Neurological:  Negative for dizziness and headaches.  ?Hematological:  Does not bruise/bleed easily.  ?Psychiatric/Behavioral:  Negative for suicidal ideas. The patient is not nervous/anxious.   ? ?Patient Active Problem List  ? Diagnosis Date Noted  ? Primary osteoarthritis of left knee 10/29/2020  ? Tear of medial meniscus of right knee, current 10/29/2020  ? Lesion of bone of knee 10/29/2020  ? ? ?Allergies  ?Allergen Reactions  ? Oxycodone-Acetaminophen   ?  Other reaction(s): Other ?Hallucinations and paranoia  ? Other   ?  Other reaction(s): Other (See Comments) ?Percocet- Hallucinations   ? Penicillins Nausea And Vomiting, Other (See Comments) and Diarrhea  ?  Migraines  ?  ? ? ?Past Surgical History:  ?Procedure Laterality Date  ? CESAREAN SECTION    ? x1  ? ECTOPIC PREGNANCY SURGERY    ? ? ?Social History  ? ?Tobacco Use  ? Smoking status: Former  ?  Years: 15.00  ?  Types: Cigarettes  ?  Quit date: 03/31/2015  ?  Years since quitting: 6.1  ? Smokeless tobacco: Never  ?Vaping Use  ? Vaping Use: Never used  ?Substance Use Topics  ? Alcohol use: Yes  ?  Comment: once a week  ? Drug use: Never  ? ? ? ?Medication list has been  reviewed and updated. ? ?Current Meds  ?Medication Sig  ? diclofenac (VOLTAREN) 75 MG EC tablet TAKE 1 TABLET BY MOUTH TWICE A DAY AS NEEDED  ? levothyroxine (SYNTHROID) 175 MCG tablet TAKE 1 TABLET BY MOUTH EVERY DAY BEFORE BREAKFAST  ? ? ?PHQ 2/9 Scores 12/26/2020 10/29/2020 06/17/2020  ?PHQ - 2 Score 0 0 0  ?PHQ- 9 Score 0 1 1  ? ? ?GAD 7 : Generalized Anxiety Score 12/26/2020 10/29/2020 06/17/2020  ?Nervous, Anxious, on Edge 0 1 0  ?Control/stop worrying 0 0 0  ?Worry too much - different things 0 0 0   ?Trouble relaxing 0 0 0  ?Restless 0 0 0  ?Easily annoyed or irritable 0 0 0  ?Afraid - awful might happen 0 0 0  ?Total GAD 7 Score 0 1 0  ?Anxiety Difficulty Not difficult at all Not difficult at all -  ? ? ?BP Readings from Last 3 Encounters:  ?06/09/21 130/80  ?05/09/21 122/80  ?12/26/20 118/84  ? ? ?Physical Exam ?Vitals and nursing note reviewed. Exam conducted with a chaperone present.  ?Constitutional:   ?   General: She is not in acute distress. ?   Appearance: She is not diaphoretic.  ?HENT:  ?   Head: Normocephalic and atraumatic.  ?   Right Ear: External ear normal.  ?   Left Ear: External ear normal.  ?   Nose: Nose normal.  ?Eyes:  ?   General:     ?   Right eye: No discharge.     ?   Left eye: No discharge.  ?   Conjunctiva/sclera: Conjunctivae normal.  ?   Pupils: Pupils are equal, round, and reactive to light.  ?Neck:  ?   Thyroid: No thyromegaly.  ?   Vascular: No JVD.  ?Cardiovascular:  ?   Rate and Rhythm: Normal rate and regular rhythm.  ?   Heart sounds: Normal heart sounds. No murmur heard. ?  No friction rub. No gallop.  ?Pulmonary:  ?   Effort: Pulmonary effort is normal.  ?   Breath sounds: Normal breath sounds.  ?Abdominal:  ?   General: Bowel sounds are normal.  ?   Palpations: Abdomen is soft. There is no mass.  ?   Tenderness: There is no abdominal tenderness. There is no guarding.  ?Musculoskeletal:     ?   General: Normal range of motion.  ?   Cervical back: Normal range of motion and neck supple.  ?Lymphadenopathy:  ?   Cervical: No cervical adenopathy.  ?Skin: ?   General: Skin is warm and dry.  ?Neurological:  ?   Mental Status: She is alert.  ?   Deep Tendon Reflexes: Reflexes are normal and symmetric.  ? ? ?Wt Readings from Last 3 Encounters:  ?06/09/21 (!) 340 lb (154.2 kg)  ?05/09/21 (!) 342 lb (155.1 kg)  ?12/26/20 (!) 353 lb (160.1 kg)  ? ? ?BP 130/80   Pulse 68   Ht '5\' 7"'  (1.702 m)   Wt (!) 340 lb (154.2 kg)   LMP 05/25/2021 (Approximate)   BMI 53.25 kg/m?   ? ?Assessment and Plan: ?1. Type 2 diabetes mellitus without complication, without long-term current use of insulin ([place]) ?Likely chronic and that patient was gestational diabetic.  Uncontrolled.  Relatively stable in course despite A1c of 8.1.  Patient was unable to tolerate metformin regular 500 twice a day so we are going to try with XR 500 to see if she tolerates that.  If unable to then we will be looking at other means of control and we will also be looking at adding a GLP-1 thinking that we may start with Victoza or Rybelsus given her history of inability to take certain medications. ?- metFORMIN (GLUCOPHAGE-XR) 500 MG 24 hr tablet; Take 1 tablet (500 mg total) by mouth daily with breakfast.  Dispense: 30 tablet; Refill: 1 ? ?2. Hyperlipidemia, unspecified hyperlipidemia type ?Chronic.  Uncontrolled.  Stable.  Patient has been given guidelines however we will likely need to start a statin given her diabetes diagnosis.  Patient has been given guidelines in the meantime we would like to start 1 medication at a time given her past history of inability to take certain medications. ? ?3. BMI 50.0-59.9, adult ([place]) ?BMI is noted to be over 50 and our intent is to probably get her on a GLP-1 for weight loss as well.  Ozempic or Mounjaro are possibilities but given her previous history of medication intolerance we would like to do 1 medication at a time and are for start with the sustained acting metformin. ? ?4. Obstructive sleep apnea syndrome ?Patient states that she needs new equipment for obstructive sleep apnea and it will be discussed with her that she needs to have a face-to-face given that its been more than 5 to 6 years ? ? ? ?

## 2021-06-16 ENCOUNTER — Telehealth: Payer: Self-pay

## 2021-06-16 NOTE — Telephone Encounter (Signed)
Called pt and left a message concerning getting pap scheduled or referral to GYN ?

## 2021-06-19 ENCOUNTER — Ambulatory Visit
Admission: RE | Admit: 2021-06-19 | Discharge: 2021-06-19 | Disposition: A | Discharge status: Home / Self Care | Payer: BC Managed Care – PPO | Source: Ambulatory Visit | Attending: Family Medicine | Admitting: Family Medicine

## 2021-06-19 ENCOUNTER — Other Ambulatory Visit: Payer: Self-pay

## 2021-06-19 DIAGNOSIS — M899 Disorder of bone, unspecified: Secondary | ICD-10-CM | POA: Diagnosis not present

## 2021-06-19 MED ORDER — GADOBUTROL 1 MMOL/ML IV SOLN
10.0000 mL | Freq: Once | INTRAVENOUS | Status: AC | PRN
  Administered 2021-06-19: 10 mL via INTRAVENOUS

## 2021-07-03 DIAGNOSIS — E063 Autoimmune thyroiditis: Secondary | ICD-10-CM | POA: Insufficient documentation

## 2021-07-07 ENCOUNTER — Ambulatory Visit: Payer: Self-pay | Admitting: Family Medicine

## 2021-07-11 ENCOUNTER — Other Ambulatory Visit: Payer: Self-pay | Admitting: Family Medicine

## 2021-07-11 DIAGNOSIS — E119 Type 2 diabetes mellitus without complications: Secondary | ICD-10-CM

## 2021-07-14 NOTE — Telephone Encounter (Signed)
Requested medication (s) are due for refill today: Yes ? ?Requested medication (s) are on the active medication list: Yes ? ?Last refill:  06/09/21 ? ?Future visit scheduled: Yes ? ?Notes to clinic:  Protocol indicates pt. Needs lab work. ? ? ? ?Requested Prescriptions  ?Pending Prescriptions Disp Refills  ? metFORMIN (GLUCOPHAGE-XR) 500 MG 24 hr tablet [Pharmacy Med Name: METFORMIN HCL ER 500 MG TABLET] 30 tablet 1  ?  Sig: TAKE 1 TABLET BY MOUTH EVERY DAY WITH BREAKFAST  ?  ? Endocrinology:  Diabetes - Biguanides Failed - 07/11/2021  2:33 PM  ?  ?  Failed - HBA1C is between 0 and 7.9 and within 180 days  ?  Hgb A1c MFr Bld  ?Date Value Ref Range Status  ?06/05/2021 8.1 (H) 4.8 - 5.6 % Final  ?  Comment:  ?           Prediabetes: 5.7 - 6.4 ?         Diabetes: >6.4 ?         Glycemic control for adults with diabetes: <7.0 ?  ?  ?  ?  ?  Failed - B12 Level in normal range and within 720 days  ?  No results found for: VITAMINB12  ?  ?  ?  Failed - CBC within normal limits and completed in the last 12 months  ?  No results found for: WBC, [place] ?No results found for: RBC, RBCKUC ?No results found for: HGB, HGBKUC, HGBPOCKUC, HGBOTHER, TOTHGB, HGBPLASMA, LABHEMOF ?No results found for: HCT, HCTKUC, SRHCT ?No results found for: MCHC, MCHCKUC ?No results found for: [facility] [place] ?No results found for: MCVKUC, MCV ?No results found for: PLTCOUNTKUC, LABPLAT, [place] ?No results found for: RDW, RDWKUC, POCRDW ?  ?  ?  Passed - Cr in normal range and within 360 days  ?  Creatinine, Ser  ?Date Value Ref Range Status  ?05/09/2021 0.88 0.57 - 1.00 mg/dL Final  ?  ?  ?  ?  Passed - eGFR in normal range and within 360 days  ?  eGFR  ?Date Value Ref Range Status  ?05/09/2021 85 >59 mL/min/1.73 Final  ?  ?  ?  ?  Passed - Valid encounter within last 6 months  ?  Recent Outpatient Visits   ? ?      ? 1 month ago Type 2 diabetes mellitus without complication, without long-term current use of insulin ([place])  ? [facility] Juline Patch, MD  ? 2 months ago Hypothyroidism due to Hashimoto's thyroiditis  ? [facility] Juline Patch, MD  ? 6 months ago Primary osteoarthritis of left knee  ? [facility] Montel Culver, MD  ? 8 months ago Primary osteoarthritis of left knee  ? [facility] Montel Culver, MD  ? 1 year ago Hypothyroidism due to Hashimoto's thyroiditis  ? [facility] Juline Patch, MD  ? ?  ?  ?Future Appointments   ? ?        ? In 1 week Juline Patch, MD [facility] [place]  ? In 3 months Juline Patch, MD [facility] [place]  ? ?  ? ? ?  ?  ?  ? ?

## 2021-07-21 ENCOUNTER — Ambulatory Visit: Payer: Self-pay | Admitting: Family Medicine

## 2021-07-26 ENCOUNTER — Encounter: Payer: Self-pay | Admitting: Family Medicine

## 2021-07-28 ENCOUNTER — Other Ambulatory Visit: Payer: Self-pay

## 2021-07-28 DIAGNOSIS — E119 Type 2 diabetes mellitus without complications: Secondary | ICD-10-CM

## 2021-07-28 MED ORDER — METFORMIN HCL ER 500 MG PO TB24: 500.0000 mg | ORAL_TABLET | Freq: Every day | ORAL | 0 refills | Status: DC

## 2021-07-31 ENCOUNTER — Encounter: Payer: Self-pay | Admitting: Family Medicine

## 2021-07-31 ENCOUNTER — Ambulatory Visit (INDEPENDENT_AMBULATORY_CARE_PROVIDER_SITE_OTHER): Payer: BC Managed Care – PPO | Admitting: Family Medicine

## 2021-07-31 VITALS — BP 110/80 | HR 80 | Ht 67.0 in | Wt 341.0 lb

## 2021-07-31 DIAGNOSIS — Z6841 Body Mass Index (BMI) 40.0 and over, adult: Secondary | ICD-10-CM | POA: Diagnosis not present

## 2021-07-31 DIAGNOSIS — E119 Type 2 diabetes mellitus without complications: Secondary | ICD-10-CM | POA: Diagnosis not present

## 2021-07-31 DIAGNOSIS — Z124 Encounter for screening for malignant neoplasm of cervix: Secondary | ICD-10-CM

## 2021-07-31 DIAGNOSIS — G4733 Obstructive sleep apnea (adult) (pediatric): Secondary | ICD-10-CM

## 2021-07-31 NOTE — Progress Notes (Signed)
? ? ?Date:  07/31/2021  ? ?Name:  Teresa Koch   DOB:  28-Mar-1981   MRN:  [id number] ? ? ?Chief Complaint: Obstructive Sleep Apnea (Was Dx with "very severe obstructive sleep apnea" in 2015. Still sleeping with CPAP on pressure setting of 14. Sleeping well- feels rested the next day. No daytime sleepiness, no snoring, no apneic events as long as wearing CPAP. Need new machine and supplies due to motor sounds like it is going to blow up.) ? ?Patient is a 41 year old female who presents for a face to face for sleep apnea exam. The patient reports the following problems: none. Health maintenance has been reviewed up to date. ?  ? ?Diabetes ?She presents for her follow-up diabetic visit. She has type 2 diabetes mellitus. Her disease course has been stable. Pertinent negatives for hypoglycemia include no dizziness, headaches or nervousness/anxiousness. Pertinent negatives for diabetes include no fatigue, no polydipsia, no polyuria and no weakness. There are no hypoglycemic complications. Symptoms are stable. There are no diabetic complications. Current diabetic treatment includes oral agent (monotherapy).  ? ?Lab Results  ?Component Value Date  ? NA 139 05/09/2021  ? K 4.7 05/09/2021  ? CO2 20 05/09/2021  ? GLUCOSE 179 (H) 05/09/2021  ? BUN 10 05/09/2021  ? CREATININE 0.88 05/09/2021  ? CALCIUM 9.8 05/09/2021  ? EGFR 85 05/09/2021  ? ?Lab Results  ?Component Value Date  ? CHOL 284 (H) 05/09/2021  ? HDL 34 (L) 05/09/2021  ? LDLCALC 176 (H) 05/09/2021  ? TRIG 374 (H) 05/09/2021  ? ?Lab Results  ?Component Value Date  ? TSH 2.780 05/09/2021  ? ?Lab Results  ?Component Value Date  ? HGBA1C 8.1 (H) 06/05/2021  ? ?No results found for: WBC, HGB, HCT, MCV, PLT ?No results found for: ALT, AST, GGT, ALKPHOS, BILITOT ?No results found for: 25OHVITD2, [place], VD25OH  ? ?Review of Systems  ?Constitutional: Negative.  Negative for chills, fatigue, fever and unexpected weight change.  ?HENT:  Negative for congestion, ear discharge,  ear pain, rhinorrhea, sinus pressure, sneezing and sore throat.   ?Respiratory:  Negative for cough, shortness of breath, wheezing and stridor.   ?Gastrointestinal:  Negative for abdominal pain, blood in stool, constipation, diarrhea and nausea.  ?Endocrine: Negative for polydipsia and polyuria.  ?Genitourinary:  Negative for dysuria, flank pain, frequency, hematuria, urgency and vaginal discharge.  ?Musculoskeletal:  Negative for arthralgias, back pain and myalgias.  ?Skin:  Negative for rash.  ?Neurological:  Negative for dizziness, weakness and headaches.  ?Hematological:  Negative for adenopathy. Does not bruise/bleed easily.  ?Psychiatric/Behavioral:  Negative for dysphoric mood. The patient is not nervous/anxious.   ? ?Patient Active Problem List  ? Diagnosis Date Noted  ? Hashimoto's disease 07/03/2021  ? Primary osteoarthritis of left knee 10/29/2020  ? Tear of medial meniscus of right knee, current 10/29/2020  ? Lesion of bone of knee 10/29/2020  ? ? ?Allergies  ?Allergen Reactions  ? Oxycodone-Acetaminophen   ?  Other reaction(s): Other ?Hallucinations and paranoia  ? Other   ?  Other reaction(s): Other (See Comments) ?Percocet- Hallucinations   ? Penicillins Nausea And Vomiting, Other (See Comments) and Diarrhea  ?  Migraines  ?  ? ? ?Past Surgical History:  ?Procedure Laterality Date  ? CESAREAN SECTION    ? x1  ? ECTOPIC PREGNANCY SURGERY    ? ? ?Social History  ? ?Tobacco Use  ? Smoking status: Former  ?  Years: 15.00  ?  Types: Cigarettes  ?  Quit date: 03/31/2015  ?  Years since quitting: 6.3  ? Smokeless tobacco: Never  ?Vaping Use  ? Vaping Use: Never used  ?Substance Use Topics  ? Alcohol use: Yes  ?  Comment: once a week  ? Drug use: Never  ? ? ? ?Medication list has been reviewed and updated. ? ?Current Meds  ?Medication Sig  ? diclofenac (VOLTAREN) 75 MG EC tablet TAKE 1 TABLET BY MOUTH TWICE A DAY AS NEEDED  ? levothyroxine (SYNTHROID) 175 MCG tablet TAKE 1 TABLET BY MOUTH EVERY DAY BEFORE  BREAKFAST  ? metFORMIN (GLUCOPHAGE-XR) 500 MG 24 hr tablet Take 1 tablet (500 mg total) by mouth daily with breakfast.  ? ? ? ?  07/31/2021  ?  9:04 AM 12/26/2020  ?  5:01 PM 10/29/2020  ?  9:36 AM 06/17/2020  ?  2:40 PM  ?GAD 7 : Generalized Anxiety Score  ?Nervous, Anxious, on Edge 0 0 1 0  ?Control/stop worrying 0 0 0 0  ?Worry too much - different things 1 0 0 0  ?Trouble relaxing 1 0 0 0  ?Restless 0 0 0 0  ?Easily annoyed or irritable 0 0 0 0  ?Afraid - awful might happen 0 0 0 0  ?Total GAD 7 Score 2 0 1 0  ?Anxiety Difficulty Not difficult at all Not difficult at all Not difficult at all   ? ? ? ?  07/31/2021  ?  9:03 AM  ?Depression screen PHQ 2/9  ?Decreased Interest 0  ?Down, Depressed, Hopeless 0  ?PHQ - 2 Score 0  ?Altered sleeping 0  ?Tired, decreased energy 1  ?Change in appetite 0  ?Feeling bad or failure about yourself  0  ?Trouble concentrating 1  ?Moving slowly or fidgety/restless 0  ?Suicidal thoughts 0  ?PHQ-9 Score 2  ?Difficult doing work/chores Somewhat difficult  ? ? ?BP Readings from Last 3 Encounters:  ?07/31/21 110/80  ?06/09/21 130/80  ?05/09/21 122/80  ? ? ?Physical Exam ?Vitals and nursing note reviewed. Exam conducted with a chaperone present.  ?Constitutional:   ?   General: She is not in acute distress. ?   Appearance: She is not diaphoretic.  ?HENT:  ?   Head: Normocephalic and atraumatic.  ?   Right Ear: Tympanic membrane and external ear normal.  ?   Left Ear: Tympanic membrane and external ear normal.  ?   Nose: Nose normal. No congestion or rhinorrhea.  ?Eyes:  ?   General:     ?   Right eye: No discharge.     ?   Left eye: No discharge.  ?   Conjunctiva/sclera: Conjunctivae normal.  ?   Pupils: Pupils are equal, round, and reactive to light.  ?Neck:  ?   Thyroid: No thyromegaly.  ?   Vascular: No carotid bruit or JVD.  ?Cardiovascular:  ?   Rate and Rhythm: Normal rate and regular rhythm.  ?   Heart sounds: Normal heart sounds, S1 normal and S2 normal. No murmur heard. ?No systolic  murmur is present.  ?No diastolic murmur is present.  ?  No friction rub. No gallop. No S3 or S4 sounds.  ?Pulmonary:  ?   Effort: Pulmonary effort is normal.  ?   Breath sounds: Normal breath sounds. No wheezing, rhonchi or rales.  ?Abdominal:  ?   General: Bowel sounds are normal.  ?   Palpations: Abdomen is soft. There is no mass.  ?   Tenderness: There is no abdominal tenderness.  There is no guarding.  ?Musculoskeletal:     ?   General: Normal range of motion.  ?   Cervical back: Normal range of motion and neck supple.  ?Lymphadenopathy:  ?   Cervical: No cervical adenopathy.  ?Skin: ?   General: Skin is warm and dry.  ?Neurological:  ?   Mental Status: She is alert.  ?   Deep Tendon Reflexes: Reflexes are normal and symmetric.  ? ? ?Wt Readings from Last 3 Encounters:  ?07/31/21 (!) 341 lb (154.7 kg)  ?06/09/21 (!) 340 lb (154.2 kg)  ?05/09/21 (!) 342 lb (155.1 kg)  ? ? ?BP 110/80   Pulse 80   Ht '5\' 7"'  (1.702 m)   Wt (!) 341 lb (154.7 kg)   LMP 07/18/2021 (Exact Date)   BMI 53.41 kg/m?  ? ?Assessment and Plan: ? ?1. Type 2 diabetes mellitus without complication, without long-term current use of insulin ([place]) ?Chronic.  Relatively controlled.  Pending recent initiation of metformin XR 500 mg once a day.  We will check A1c today to see if this is coming to normal range and if not we will have consideration of adding a GLP-1 Rybelsus or Ozempic. ?- Hemoglobin A1c ? ?2. Obstructive sleep apnea of adult ?Chronic.  Persistent.  Patient has had CPAP and this has been controlled for over a decade.  But machine is wearing down and needs new tubing and we will refer to ear nose and throat for obtaining new equipment. ?- Ambulatory referral to ENT ? ?3. Cervical cancer screening ?Discussed with patient and referral to gynecology for Pap. ?- Ambulatory referral to Gynecology ? ?4. BMI 50.0-59.9, adult ([place]) ?Health risks of being over weight were discussed and patient was counseled on weight loss options and exercise.   Patient has been given Mediterranean diet for weight loss and health maintenance. ? ? ?

## 2021-07-31 NOTE — Patient Instructions (Signed)

## 2021-08-01 ENCOUNTER — Other Ambulatory Visit: Payer: Self-pay

## 2021-08-01 DIAGNOSIS — E119 Type 2 diabetes mellitus without complications: Secondary | ICD-10-CM

## 2021-08-01 LAB — HEMOGLOBIN A1C
Est. average glucose Bld gHb Est-mCnc: 177 mg/dL
Hgb A1c MFr Bld: 7.8 % — ABNORMAL HIGH (ref 4.8–5.6)

## 2021-08-01 MED ORDER — OZEMPIC (0.25 OR 0.5 MG/DOSE) 2 MG/1.5ML ~~LOC~~ SOPN: 0.2500 mg | PEN_INJECTOR | SUBCUTANEOUS | 0 refills | Status: DC

## 2021-08-01 MED ORDER — "PEN NEEDLES 3/16"" 31G X 5 MM MISC": 1.0000 | 0 refills | Status: AC

## 2021-08-01 NOTE — Progress Notes (Signed)
Sent in ozempic and needles ?

## 2021-08-20 ENCOUNTER — Other Ambulatory Visit: Payer: Self-pay | Admitting: Family Medicine

## 2021-08-20 DIAGNOSIS — E119 Type 2 diabetes mellitus without complications: Secondary | ICD-10-CM

## 2021-08-21 ENCOUNTER — Other Ambulatory Visit: Payer: Self-pay | Admitting: Family Medicine

## 2021-08-21 DIAGNOSIS — E119 Type 2 diabetes mellitus without complications: Secondary | ICD-10-CM

## 2021-08-29 ENCOUNTER — Other Ambulatory Visit: Payer: Self-pay | Admitting: Family Medicine

## 2021-08-29 DIAGNOSIS — E119 Type 2 diabetes mellitus without complications: Secondary | ICD-10-CM

## 2021-09-19 ENCOUNTER — Encounter: Payer: Self-pay | Admitting: Family Medicine

## 2021-09-19 ENCOUNTER — Ambulatory Visit (INDEPENDENT_AMBULATORY_CARE_PROVIDER_SITE_OTHER): Payer: BC Managed Care – PPO | Admitting: Family Medicine

## 2021-09-19 DIAGNOSIS — E119 Type 2 diabetes mellitus without complications: Secondary | ICD-10-CM | POA: Diagnosis not present

## 2021-09-19 MED ORDER — OZEMPIC (0.25 OR 0.5 MG/DOSE) 2 MG/3ML ~~LOC~~ SOPN: 0.5000 mg | PEN_INJECTOR | SUBCUTANEOUS | 3 refills | Status: DC

## 2021-09-19 NOTE — Progress Notes (Signed)
Date:  09/19/2021   Name:  Teresa Koch   DOB:  27-Jun-1980   MRN:  [id number]   Chief Complaint: Diabetes (130s for fasting BS)  Diabetes She presents for her follow-up diabetic visit. She has type 2 diabetes mellitus. Her disease course has been stable. There are no hypoglycemic associated symptoms. Pertinent negatives for diabetes include no blurred vision, no chest pain, no fatigue, no foot paresthesias, no polydipsia, no polyuria, no weakness and no weight loss. There are no hypoglycemic complications. Symptoms are stable. There are no diabetic complications. Current diabetic treatments: ozempic. Her weight is stable. She is following a generally healthy diet. Meal planning includes avoidance of concentrated sweets and carbohydrate counting. She participates in exercise daily. Her breakfast blood glucose is taken between 8-9 am. Her breakfast blood glucose range is generally 130-140 mg/dl.    Lab Results  Component Value Date   NA 139 05/09/2021   K 4.7 05/09/2021   CO2 20 05/09/2021   GLUCOSE 179 (H) 05/09/2021   BUN 10 05/09/2021   CREATININE 0.88 05/09/2021   CALCIUM 9.8 05/09/2021   EGFR 85 05/09/2021   Lab Results  Component Value Date   CHOL 284 (H) 05/09/2021   HDL 34 (L) 05/09/2021   LDLCALC 176 (H) 05/09/2021   TRIG 374 (H) 05/09/2021   Lab Results  Component Value Date   TSH 2.780 05/09/2021   Lab Results  Component Value Date   HGBA1C 7.8 (H) 07/31/2021   No results found for: "WBC", "HGB", "HCT", "MCV", "PLT" No results found for: "ALT", "AST", "GGT", "ALKPHOS", "BILITOT" No results found for: "25OHVITD2", "25OHVITD3", "VD25OH"   Review of Systems  Constitutional:  Negative for fatigue and weight loss.  Eyes:  Negative for blurred vision and visual disturbance.  Respiratory:  Negative for shortness of breath.   Cardiovascular:  Negative for chest pain, palpitations and leg swelling.  Gastrointestinal:  Positive for constipation.  Endocrine: Negative  for polydipsia and polyuria.  Neurological:  Negative for weakness.    Patient Active Problem List   Diagnosis Date Noted   Hashimoto's disease 07/03/2021   Primary osteoarthritis of left knee 10/29/2020   Tear of medial meniscus of right knee, current 10/29/2020   Lesion of bone of knee 10/29/2020    Allergies  Allergen Reactions   Oxycodone-Acetaminophen     Other reaction(s): Other Hallucinations and paranoia   Other     Other reaction(s): Other (See Comments) Percocet- Hallucinations    Penicillins Nausea And Vomiting, Other (See Comments) and Diarrhea    Migraines      Past Surgical History:  Procedure Laterality Date   CESAREAN SECTION     x1   ECTOPIC PREGNANCY SURGERY      Social History   Tobacco Use   Smoking status: Former    Years: 15.00    Types: Cigarettes    Quit date: 03/31/2015    Years since quitting: 6.4   Smokeless tobacco: Never  Vaping Use   Vaping Use: Never used  Substance Use Topics   Alcohol use: Yes    Comment: once a week   Drug use: Never     Medication list has been reviewed and updated.  Current Meds  Medication Sig   diclofenac (VOLTAREN) 75 MG EC tablet TAKE 1 TABLET BY MOUTH TWICE A DAY AS NEEDED   Insulin Pen Needle (PEN NEEDLES 3/16") 31G X 5 MM MISC 1 each by Does not apply route once a week.   levothyroxine (SYNTHROID)  175 MCG tablet TAKE 1 TABLET BY MOUTH EVERY DAY BEFORE BREAKFAST   metFORMIN (GLUCOPHAGE-XR) 500 MG 24 hr tablet TAKE 1 TABLET BY MOUTH EVERY DAY WITH BREAKFAST   OZEMPIC, 0.25 OR 0.5 MG/DOSE, 2 MG/3ML SOPN INJECT 0.25MG  INTO THE SKIN ONE TIME PER WEEK       07/31/2021    9:04 AM 12/26/2020    5:01 PM 10/29/2020    9:36 AM 06/17/2020    2:40 PM  GAD 7 : Generalized Anxiety Score  Nervous, Anxious, on Edge 0 0 1 0  Control/stop worrying 0 0 0 0  Worry too much - different things 1 0 0 0  Trouble relaxing 1 0 0 0  Restless 0 0 0 0  Easily annoyed or irritable 0 0 0 0  Afraid - awful might happen 0 0 0  0  Total GAD 7 Score 2 0 1 0  Anxiety Difficulty Not difficult at all Not difficult at all Not difficult at all        07/31/2021    9:03 AM  Depression screen PHQ 2/9  Decreased Interest 0  Down, Depressed, Hopeless 0  PHQ - 2 Score 0  Altered sleeping 0  Tired, decreased energy 1  Change in appetite 0  Feeling bad or failure about yourself  0  Trouble concentrating 1  Moving slowly or fidgety/restless 0  Suicidal thoughts 0  PHQ-9 Score 2  Difficult doing work/chores Somewhat difficult    BP Readings from Last 3 Encounters:  09/19/21 118/80  07/31/21 110/80  06/09/21 130/80    Physical Exam Vitals and nursing note reviewed.  HENT:     Right Ear: Tympanic membrane and ear canal normal.     Left Ear: Tympanic membrane and ear canal normal.  Neck:     Thyroid: No thyroid mass, thyromegaly or thyroid tenderness.  Cardiovascular:     Chest Wall: PMI is not displaced. No thrill.     Pulses: Normal pulses.     Heart sounds: S1 normal and S2 normal.     No systolic murmur is present.     No diastolic murmur is present.     No gallop. No S3 or S4 sounds.  Pulmonary:     Breath sounds: Normal breath sounds. No decreased breath sounds, wheezing, rhonchi or rales.  Abdominal:     Palpations: There is no hepatomegaly or splenomegaly.  Musculoskeletal:     Right lower leg: No edema.     Left lower leg: No edema.  Lymphadenopathy:     Cervical:     Right cervical: No superficial, deep or posterior cervical adenopathy.    Left cervical: No superficial, deep or posterior cervical adenopathy.  Neurological:     Mental Status: She is alert.     Wt Readings from Last 3 Encounters:  09/19/21 (!) 340 lb (154.2 kg)  07/31/21 (!) 341 lb (154.7 kg)  06/09/21 (!) 340 lb (154.2 kg)    BP 118/80   Pulse 68   Ht 5\' 7"  (1.702 m)   Wt (!) 340 lb (154.2 kg)   LMP 09/10/2021 (Approximate)   BMI 53.25 kg/m   Assessment and Plan:  1. Type 2 diabetes mellitus without  complication, without long-term current use of insulin ([facility]) Chronic.  Near controlled.  Stable.  Blood sugars fasting are in the 130 range.  No polyphagia no polyuria no polydipsia.  Patient tolerating Ozempic 0.25.  Will increase to 0.5 weekly.  We will check microalbuminuria A1c and  will recheck patient in 4 months. - Microalbumin, urine - HgB A1c - Semaglutide,0.25 or 0.5MG /DOS, (OZEMPIC,  0.5 MG/DOSE,) 2 MG/3ML SOPN; Inject 0.5 mg into the skin every 7 (seven) days.  Dispense: 3 mL; Refill: 3

## 2021-09-21 LAB — HEMOGLOBIN A1C
Est. average glucose Bld gHb Est-mCnc: 169 mg/dL
Hgb A1c MFr Bld: 7.5 % — ABNORMAL HIGH (ref 4.8–5.6)

## 2021-09-21 LAB — MICROALBUMIN, URINE: Microalbumin, Urine: <3 ug/mL

## 2021-11-07 ENCOUNTER — Encounter: Payer: Self-pay | Admitting: Family Medicine

## 2021-11-07 ENCOUNTER — Ambulatory Visit (INDEPENDENT_AMBULATORY_CARE_PROVIDER_SITE_OTHER): Payer: BC Managed Care – PPO | Admitting: Family Medicine

## 2021-11-07 VITALS — BP 137/80 | HR 105 | Ht 67.0 in | Wt 327.0 lb

## 2021-11-07 DIAGNOSIS — E038 Other specified hypothyroidism: Secondary | ICD-10-CM

## 2021-11-07 DIAGNOSIS — E782 Mixed hyperlipidemia: Secondary | ICD-10-CM | POA: Diagnosis not present

## 2021-11-07 DIAGNOSIS — E063 Autoimmune thyroiditis: Secondary | ICD-10-CM | POA: Diagnosis not present

## 2021-11-07 DIAGNOSIS — E119 Type 2 diabetes mellitus without complications: Secondary | ICD-10-CM

## 2021-11-07 MED ORDER — LEVOTHYROXINE SODIUM 175 MCG PO TABS: ORAL_TABLET | ORAL | 1 refills | Status: DC

## 2021-11-07 MED ORDER — LISINOPRIL 5 MG PO TABS: 5.0000 mg | ORAL_TABLET | Freq: Every day | ORAL | 0 refills | Status: DC

## 2021-11-07 MED ORDER — METFORMIN HCL ER 500 MG PO TB24: ORAL_TABLET | ORAL | 1 refills | Status: DC

## 2021-11-07 MED ORDER — OZEMPIC (0.25 OR 0.5 MG/DOSE) 2 MG/3ML ~~LOC~~ SOPN: 0.5000 mg | PEN_INJECTOR | SUBCUTANEOUS | 3 refills | Status: DC

## 2021-11-07 NOTE — Progress Notes (Unsigned)
Date:  11/07/2021   Name:  Teresa [facility]   DOB:  [birth year]   MRN:  [id number]   Chief Complaint: Diabetes and Hypothyroidism  Diabetes She presents for her follow-up diabetic visit. She has type 2 diabetes mellitus. Her disease course has been stable. There are no hypoglycemic associated symptoms. Pertinent negatives for hypoglycemia include no confusion, dizziness, headaches or nervousness/anxiousness. There are no diabetic associated symptoms. Pertinent negatives for diabetes include no blurred vision, no chest pain, no fatigue, no foot paresthesias, no foot ulcerations, no polydipsia, no polyuria, no visual change, no weakness and no weight loss. There are no hypoglycemic complications. Symptoms are stable. There are no diabetic complications. There are no known risk factors for coronary artery disease. Current diabetic treatment includes oral agent (monotherapy) (victoza). Her weight is stable. She participates in exercise daily. Her breakfast blood glucose range is generally 130-140 mg/dl. An ACE inhibitor/angiotensin II receptor blocker is being taken.  Hyperlipidemia This is a chronic problem. The current episode started more than 1 month ago. Recent lipid tests were reviewed and are high. Exacerbating diseases include diabetes, hypothyroidism and obesity. Pertinent negatives include no chest pain, myalgias or shortness of breath. Current antihyperlipidemic treatment includes diet change. The current treatment provides moderate improvement of lipids. Risk factors for coronary artery disease include dyslipidemia.    Lab Results  Component Value Date   NA 139 05/09/2021   K 4.7 05/09/2021   CO2 20 05/09/2021   GLUCOSE 179 (H) 05/09/2021   BUN 10 05/09/2021   CREATININE 0.88 05/09/2021   CALCIUM 9.8 05/09/2021   EGFR 85 05/09/2021   Lab Results  Component Value Date   CHOL 284 (H) 05/09/2021   HDL 34 (L) 05/09/2021   LDLCALC 176 (H) 05/09/2021   TRIG 374 (H) 05/09/2021   Lab  Results  Component Value Date   TSH 2.780 05/09/2021   Lab Results  Component Value Date   HGBA1C 7.5 (H) 09/19/2021   No results found for: "WBC", "HGB", "HCT", "MCV", "PLT" No results found for: "ALT", "AST", "GGT", "ALKPHOS", "BILITOT" No results found for: "25OHVITD2", "25OHVITD3", "VD25OH"   Review of Systems  Constitutional:  Negative for fatigue and weight loss.  HENT:  Negative for congestion.   Eyes:  Negative for blurred vision.  Respiratory:  Negative for chest tightness, shortness of breath and wheezing.   Cardiovascular:  Negative for chest pain, palpitations and leg swelling.  Gastrointestinal:  Negative for abdominal pain.  Endocrine: Negative for polydipsia and polyuria.  Genitourinary:  Negative for difficulty urinating and menstrual problem.  Musculoskeletal:  Negative for myalgias.  Neurological:  Negative for dizziness, weakness and headaches.  Psychiatric/Behavioral:  Negative for confusion. The patient is not nervous/anxious.     Patient Active Problem List   Diagnosis Date Noted   Hashimoto's disease 07/03/2021   Primary osteoarthritis of left knee 10/29/2020   Tear of medial meniscus of right knee, current 10/29/2020   Lesion of bone of knee 10/29/2020    Allergies  Allergen Reactions   Oxycodone-Acetaminophen     Other reaction(s): Other Hallucinations and paranoia   Other     Other reaction(s): Other (See Comments) Percocet- Hallucinations    Penicillins Nausea And Vomiting, Other (See Comments) and Diarrhea    Migraines      Past Surgical History:  Procedure Laterality Date   CESAREAN SECTION     x1   ECTOPIC PREGNANCY SURGERY      Social History   Tobacco Use   Smoking  status: Former    Years: 15.00    Types: Cigarettes    Quit date: 03/31/2015    Years since quitting: 6.6   Smokeless tobacco: Never  Vaping Use   Vaping Use: Never used  Substance Use Topics   Alcohol use: Yes    Comment: once a week   Drug use: Never      Medication list has been reviewed and updated.  Current Meds  Medication Sig   diclofenac (VOLTAREN) 75 MG EC tablet TAKE 1 TABLET BY MOUTH TWICE A DAY AS NEEDED   Insulin Pen Needle (PEN NEEDLES 3/16") 31G X 5 MM MISC 1 each by Does not apply route once a week.   levothyroxine (SYNTHROID) 175 MCG tablet TAKE 1 TABLET BY MOUTH EVERY DAY BEFORE BREAKFAST   metFORMIN (GLUCOPHAGE-XR) 500 MG 24 hr tablet TAKE 1 TABLET BY MOUTH EVERY DAY WITH BREAKFAST   Semaglutide,0.25 or 0.5MG/DOS, (OZEMPIC, 0.25 OR 0.5 MG/DOSE,) 2 MG/3ML SOPN Inject 0.5 mg into the skin every 7 (seven) days.       11/07/2021   10:22 AM 07/31/2021    9:04 AM 12/26/2020    5:01 PM 10/29/2020    9:36 AM  GAD 7 : Generalized Anxiety Score  Nervous, Anxious, on Edge 0 0 0 1  Control/stop worrying 0 0 0 0  Worry too much - different things 0 1 0 0  Trouble relaxing 0 1 0 0  Restless 0 0 0 0  Easily annoyed or irritable 0 0 0 0  Afraid - awful might happen 0 0 0 0  Total GAD 7 Score 0 2 0 1  Anxiety Difficulty Not difficult at all Not difficult at all Not difficult at all Not difficult at all       11/07/2021   10:22 AM 07/31/2021    9:03 AM 12/26/2020    5:01 PM  Depression screen PHQ 2/9  Decreased Interest 0 0 0  Down, Depressed, Hopeless 0 0 0  PHQ - 2 Score 0 0 0  Altered sleeping 0 0 0  Tired, decreased energy 0 1 0  Change in appetite 0 0 0  Feeling bad or failure about yourself  0 0 0  Trouble concentrating 0 1 0  Moving slowly or fidgety/restless 0 0 0  Suicidal thoughts 0 0 0  PHQ-9 Score 0 2 0  Difficult doing work/chores Not difficult at all Somewhat difficult Not difficult at all    BP Readings from Last 3 Encounters:  11/07/21 137/80  09/19/21 118/80  07/31/21 110/80    Physical Exam Vitals and nursing note reviewed.  Constitutional:      Appearance: She is obese.  HENT:     Head: Normocephalic.     Right Ear: Tympanic membrane normal.     Left Ear: Tympanic membrane normal.      Nose: Nose normal.     Mouth/Throat:     Mouth: Mucous membranes are moist.  Eyes:     Pupils: Pupils are equal, round, and reactive to light.  Neck:     Thyroid: No thyroid mass, thyromegaly or thyroid tenderness.  Cardiovascular:     Rate and Rhythm: Normal rate and regular rhythm.     Pulses: Normal pulses.     Heart sounds: No murmur heard.    No friction rub. No gallop.  Pulmonary:     Effort: Pulmonary effort is normal.     Breath sounds: No wheezing or rhonchi.  Abdominal:  Palpations: There is no hepatomegaly or splenomegaly.     Tenderness: There is no abdominal tenderness.  Musculoskeletal:     Cervical back: Normal range of motion.  Lymphadenopathy:     Cervical: No cervical adenopathy.     Wt Readings from Last 3 Encounters:  11/07/21 (!) 327 lb (148.3 kg)  09/19/21 (!) 340 lb (154.2 kg)  07/31/21 (!) 341 lb (154.7 kg)    BP 137/80 (Cuff Size: Large)   Pulse (!) 105   Ht '5\' 7"'  (1.702 m)   Wt (!) 327 lb (148.3 kg)   LMP 11/05/2021 (Approximate)   SpO2 98%   BMI 51.22 kg/m   Assessment and Plan:  1. Hypothyroidism due to Hashimoto's thyroiditis Chronic.  Persistent.  Stable.  Controlled.  Patient has a history of Hashimoto's.  Thyroiditis and is currently controlled on levothyroxine 175 mcg daily.  Review of previous TSH is in acceptable range and we will continue with this dosage. - levothyroxine (SYNTHROID) 175 MCG tablet; TAKE 1 TABLET BY MOUTH EVERY DAY BEFORE BREAKFAST  Dispense: 90 tablet; Refill: 1  2. Type 2 diabetes mellitus without complication, without long-term current use of insulin (HCC) Chronic.  Controlled.  Stable.  Asymptomatic.  Improvement of A1c to 6.9 in control range previous month was 7.5.  Patient was placed on semaglutide 0.5 mg once a week and is tolerating without symptomatology or side effects.  A1c was noted to be improved as previously noted lisinopril 5 mg is being taken for control of blood pressure is well as protecting  kidneys from diabetic nephropathy.  We will recheck patient in 4 months. - metFORMIN (GLUCOPHAGE-XR) 500 MG 24 hr tablet; TAKE 1 TABLET BY MOUTH EVERY DAY WITH BREAKFAST  Dispense: 90 tablet; Refill: 1 - Semaglutide,0.25 or 0.5MG/DOS, (OZEMPIC, 0.25 OR 0.5 MG/DOSE,) 2 MG/3ML SOPN; Inject 0.5 mg into the skin every 7 (seven) days.  Dispense: 3 mL; Refill: 3 - HgB A1c - lisinopril (ZESTRIL) 5 MG tablet; Take 1 tablet (5 mg total) by mouth daily.  Dispense: 90 tablet; Refill: 0  3. Mixed hyperlipidemia Chronic.  Controlled.  Stable.  We will check lipid panel and address with possible statin if necessary - Lipid Panel With LDL/HDL Ratio    Otilio Miu, MD

## 2021-11-08 LAB — HEMOGLOBIN A1C
Est. average glucose Bld gHb Est-mCnc: 151 mg/dL
Hgb A1c MFr Bld: 6.9 % — ABNORMAL HIGH (ref 4.8–5.6)

## 2021-11-08 LAB — LIPID PANEL WITH LDL/HDL RATIO
Cholesterol, Total: 277 mg/dL — ABNORMAL HIGH (ref 100–199)
HDL: 41 mg/dL (ref >39)
LDL Chol Calc (NIH): 191 mg/dL — ABNORMAL HIGH (ref 0–99)
LDL/HDL Ratio: 4.7 ratio — ABNORMAL HIGH (ref 0.0–3.2)
Triglycerides: 235 mg/dL — ABNORMAL HIGH (ref 0–149)
VLDL Cholesterol Cal: 45 mg/dL — ABNORMAL HIGH (ref 5–40)

## 2021-11-10 ENCOUNTER — Encounter: Payer: Self-pay | Admitting: Family Medicine

## 2021-11-14 ENCOUNTER — Other Ambulatory Visit: Payer: Self-pay

## 2021-11-14 ENCOUNTER — Encounter: Payer: Self-pay | Admitting: Family Medicine

## 2021-11-14 DIAGNOSIS — E119 Type 2 diabetes mellitus without complications: Secondary | ICD-10-CM

## 2021-11-21 ENCOUNTER — Telehealth: Payer: Self-pay

## 2021-11-21 NOTE — Telephone Encounter (Signed)
Called pt to inform flu shots available- left message

## 2021-12-18 DIAGNOSIS — Z1231 Encounter for screening mammogram for malignant neoplasm of breast: Secondary | ICD-10-CM | POA: Diagnosis not present

## 2021-12-18 DIAGNOSIS — Z01419 Encounter for gynecological examination (general) (routine) without abnormal findings: Secondary | ICD-10-CM | POA: Diagnosis not present

## 2021-12-18 DIAGNOSIS — Z124 Encounter for screening for malignant neoplasm of cervix: Secondary | ICD-10-CM | POA: Diagnosis not present

## 2021-12-18 LAB — HM PAP SMEAR: HM Pap smear: NORMAL

## 2021-12-18 LAB — RESULTS CONSOLE HPV: CHL HPV: NEGATIVE

## 2022-01-05 ENCOUNTER — Ambulatory Visit (INDEPENDENT_AMBULATORY_CARE_PROVIDER_SITE_OTHER): Payer: BC Managed Care – PPO | Admitting: Family Medicine

## 2022-01-05 ENCOUNTER — Encounter: Payer: Self-pay | Admitting: Family Medicine

## 2022-01-05 VITALS — BP 120/79 | HR 80 | Ht 67.0 in | Wt 326.0 lb

## 2022-01-05 DIAGNOSIS — R350 Frequency of micturition: Secondary | ICD-10-CM

## 2022-01-05 DIAGNOSIS — N3090 Cystitis, unspecified without hematuria: Secondary | ICD-10-CM

## 2022-01-05 LAB — POCT URINALYSIS DIPSTICK
Bilirubin, UA: NEGATIVE
Glucose, UA: NEGATIVE
Ketones, UA: NEGATIVE
Nitrite, UA: NEGATIVE
Protein, UA: NEGATIVE
Spec Grav, UA: 1.02 (ref 1.010–1.025)
Urobilinogen, UA: 0.2 E.U./dL
pH, UA: 7 (ref 5.0–8.0)

## 2022-01-05 MED ORDER — NITROFURANTOIN MONOHYD MACRO 100 MG PO CAPS: 100.0000 mg | ORAL_CAPSULE | Freq: Two times a day (BID) | ORAL | 0 refills | Status: AC

## 2022-01-05 NOTE — Progress Notes (Signed)
Date:  01/05/2022   Name:  Teresa Koch   DOB:  [birth year]   MRN:  [id number]   Chief Complaint: Urinary Tract Infection (Felt it yesterday, got worse today)  Urinary Tract Infection  This is a new problem. The current episode started yesterday. The problem occurs intermittently. The problem has been unchanged. The quality of the pain is described as burning. The pain is moderate. Associated symptoms include frequency and urgency. Pertinent negatives include no chills, discharge, flank pain, hematuria or sweats. She has tried nothing for the symptoms. The treatment provided no relief.    Lab Results  Component Value Date   NA 139 05/09/2021   K 4.7 05/09/2021   CO2 20 05/09/2021   GLUCOSE 179 (H) 05/09/2021   BUN 10 05/09/2021   CREATININE 0.88 05/09/2021   CALCIUM 9.8 05/09/2021   EGFR 85 05/09/2021   Lab Results  Component Value Date   CHOL 277 (H) 11/07/2021   HDL 41 11/07/2021   LDLCALC 191 (H) 11/07/2021   TRIG 235 (H) 11/07/2021   Lab Results  Component Value Date   TSH 2.780 05/09/2021   Lab Results  Component Value Date   HGBA1C 6.9 (H) 11/07/2021   No results found for: "WBC", "HGB", "HCT", "MCV", "PLT" No results found for: "ALT", "AST", "GGT", "ALKPHOS", "BILITOT" No results found for: "25OHVITD2", "25OHVITD3", "VD25OH"   Review of Systems  Constitutional:  Negative for chills.  Respiratory:  Negative for shortness of breath and wheezing.   Cardiovascular:  Negative for chest pain, palpitations and leg swelling.  Genitourinary:  Positive for frequency and urgency. Negative for dysuria, flank pain, hematuria, pelvic pain and vaginal discharge.    Patient Active Problem List   Diagnosis Date Noted   Hashimoto's disease 07/03/2021   Primary osteoarthritis of left knee 10/29/2020   Tear of medial meniscus of right knee, current 10/29/2020   Lesion of bone of knee 10/29/2020    Allergies  Allergen Reactions   Oxycodone-Acetaminophen     Other  reaction(s): Other Hallucinations and paranoia   Other     Other reaction(s): Other (See Comments) Percocet- Hallucinations    Penicillins Nausea And Vomiting, Other (See Comments) and Diarrhea    Migraines      Past Surgical History:  Procedure Laterality Date   CESAREAN SECTION     x1   ECTOPIC PREGNANCY SURGERY      Social History   Tobacco Use   Smoking status: Former    Years: 15.00    Types: Cigarettes    Quit date: 03/31/2015    Years since quitting: 6.7   Smokeless tobacco: Never  Vaping Use   Vaping Use: Never used  Substance Use Topics   Alcohol use: Yes    Comment: once a week   Drug use: Never     Medication list has been reviewed and updated.  Current Meds  Medication Sig   diclofenac (VOLTAREN) 75 MG EC tablet TAKE 1 TABLET BY MOUTH TWICE A DAY AS NEEDED   Insulin Pen Needle (PEN NEEDLES 3/16") 31G X 5 MM MISC 1 each by Does not apply route once a week.   levothyroxine (SYNTHROID) 175 MCG tablet TAKE 1 TABLET BY MOUTH EVERY DAY BEFORE BREAKFAST   lisinopril (ZESTRIL) 5 MG tablet Take 1 tablet (5 mg total) by mouth daily.   metFORMIN (GLUCOPHAGE-XR) 500 MG 24 hr tablet TAKE 1 TABLET BY MOUTH EVERY DAY WITH BREAKFAST   Semaglutide,0.25 or 0.5MG/DOS, (OZEMPIC, 0.25 OR 0.5 MG/DOSE,)  2 MG/3ML SOPN Inject 0.5 mg into the skin every 7 (seven) days.       01/05/2022    8:21 AM 11/07/2021   10:22 AM 07/31/2021    9:04 AM 12/26/2020    5:01 PM  GAD 7 : Generalized Anxiety Score  Nervous, Anxious, on Edge 0 0 0 0  Control/stop worrying 0 0 0 0  Worry too much - different things 0 0 1 0  Trouble relaxing 0 0 1 0  Restless 0 0 0 0  Easily annoyed or irritable 0 0 0 0  Afraid - awful might happen 0 0 0 0  Total GAD 7 Score 0 0 2 0  Anxiety Difficulty Not difficult at all Not difficult at all Not difficult at all Not difficult at all       01/05/2022    8:21 AM 11/07/2021   10:22 AM 07/31/2021    9:03 AM  Depression screen PHQ 2/9  Decreased Interest 0 0 0   Down, Depressed, Hopeless 0 0 0  PHQ - 2 Score 0 0 0  Altered sleeping 0 0 0  Tired, decreased energy 0 0 1  Change in appetite 0 0 0  Feeling bad or failure about yourself  0 0 0  Trouble concentrating 0 0 1  Moving slowly or fidgety/restless 0 0 0  Suicidal thoughts 0 0 0  PHQ-9 Score 0 0 2  Difficult doing work/chores Not difficult at all Not difficult at all Somewhat difficult    BP Readings from Last 3 Encounters:  01/05/22 120/80  11/07/21 137/80  09/19/21 118/80    Physical Exam Vitals and nursing note reviewed. Exam conducted with a chaperone present.  Constitutional:      General: She is not in acute distress.    Appearance: She is not diaphoretic.  HENT:     Head: Normocephalic and atraumatic.     Right Ear: External ear normal.     Left Ear: External ear normal.     Nose: Nose normal.     Mouth/Throat:     Mouth: Mucous membranes are moist.  Eyes:     General:        Right eye: No discharge.        Left eye: No discharge.     Conjunctiva/sclera: Conjunctivae normal.     Pupils: Pupils are equal, round, and reactive to light.  Neck:     Thyroid: No thyromegaly.     Vascular: No JVD.  Cardiovascular:     Rate and Rhythm: Normal rate and regular rhythm.     Heart sounds: Normal heart sounds. No murmur heard.    No friction rub. No gallop.  Pulmonary:     Effort: Pulmonary effort is normal.     Breath sounds: Normal breath sounds. No wheezing, rhonchi or rales.  Abdominal:     General: Bowel sounds are normal.     Palpations: Abdomen is soft. There is no mass.     Tenderness: There is abdominal tenderness in the suprapubic area. There is no right CVA tenderness, left CVA tenderness or guarding.  Musculoskeletal:        General: Normal range of motion.     Cervical back: Normal range of motion and neck supple.  Lymphadenopathy:     Cervical: No cervical adenopathy.  Skin:    General: Skin is warm and dry.  Neurological:     Mental Status: She is  alert.     Deep Tendon Reflexes:  Reflexes are normal and symmetric.     Wt Readings from Last 3 Encounters:  01/05/22 (!) 326 lb (147.9 kg)  11/07/21 (!) 327 lb (148.3 kg)  09/19/21 (!) 340 lb (154.2 kg)    BP 120/80   Pulse 80   Ht 5' 7" (1.702 m)   Wt (!) 326 lb (147.9 kg)   BMI 51.06 kg/m   Assessment and Plan:  1. Urinary frequency New onset.  Yesterday and this morning patient has felt pressure suprapubic with frequency urgency and dysuria.  Urinalysis was done and was positive for leukocytes nitrates and erythrocytes. - POCT urinalysis dipstick  2. Cystitis New onset.  Persistent.  Stable.  No CVA tenderness is noted but suprapubic tenderness is noted.  Urinalysis is consistent with UTI and we will treat with Macrobid 100 mg twice a day for 5 days.  Patient is to call if symptoms persist and next that we will be culture. - nitrofurantoin, macrocrystal-monohydrate, (MACROBID) 100 MG capsule; Take 1 capsule (100 mg total) by mouth 2 (two) times daily for 5 days.  Dispense: 10 capsule; Refill: 0    Otilio Miu, MD

## 2022-01-13 ENCOUNTER — Other Ambulatory Visit (INDEPENDENT_AMBULATORY_CARE_PROVIDER_SITE_OTHER): Payer: BC Managed Care – PPO

## 2022-01-13 DIAGNOSIS — N3001 Acute cystitis with hematuria: Secondary | ICD-10-CM

## 2022-01-13 DIAGNOSIS — N3090 Cystitis, unspecified without hematuria: Secondary | ICD-10-CM

## 2022-01-13 LAB — POCT URINALYSIS DIPSTICK
Bilirubin, UA: NEGATIVE
Glucose, UA: NEGATIVE
Ketones, UA: NEGATIVE
Nitrite, UA: NEGATIVE
Protein, UA: NEGATIVE
Spec Grav, UA: 1.01 (ref 1.010–1.025)
Urobilinogen, UA: 0.2 E.U./dL
pH, UA: 6 (ref 5.0–8.0)

## 2022-01-13 MED ORDER — CIPROFLOXACIN HCL 250 MG PO TABS: 250.0000 mg | ORAL_TABLET | Freq: Two times a day (BID) | ORAL | 0 refills | Status: AC

## 2022-01-16 LAB — URINE CULTURE

## 2022-01-19 ENCOUNTER — Ambulatory Visit: Payer: BC Managed Care – PPO | Admitting: Family Medicine

## 2022-02-01 ENCOUNTER — Other Ambulatory Visit: Payer: Self-pay | Admitting: Family Medicine

## 2022-02-01 DIAGNOSIS — E119 Type 2 diabetes mellitus without complications: Secondary | ICD-10-CM

## 2022-02-01 DIAGNOSIS — M25562 Pain in left knee: Secondary | ICD-10-CM

## 2022-02-02 NOTE — Telephone Encounter (Signed)
Requested medications are due for refill today.  yes  Requested medications are on the active medications list.  yes  Last refill. 03/10/2021 #60 1 rf  Future visit scheduled.   yes  Notes to clinic.  Missing labs    Requested Prescriptions  Pending Prescriptions Disp Refills   diclofenac (VOLTAREN) 75 MG EC tablet [Pharmacy Med Name: DICLOFENAC SOD EC 75 MG TAB] 60 tablet 1    Sig: TAKE 1 TABLET BY MOUTH TWICE A DAY AS NEEDED     Analgesics:  NSAIDS Failed - 02/01/2022  8:30 AM      Failed - Manual Review: Labs are only required if the patient has taken medication for more than 8 weeks.      Failed - HGB in normal range and within 360 days    No results found for: "HGB", "HGBKUC", "HGBPOCKUC", "HGBOTHER", "TOTHGB", "HGBPLASMA"       Failed - PLT in normal range and within 360 days    No results found for: "PLT", "PLTCOUNTKUC", "LABPLAT", "POCPLA"       Failed - HCT in normal range and within 360 days    No results found for: "HCT", "HCTKUC", "SRHCT"       Passed - Cr in normal range and within 360 days    Creatinine, Ser  Date Value Ref Range Status  05/09/2021 0.88 0.57 - 1.00 mg/dL Final         Passed - eGFR is 30 or above and within 360 days    eGFR  Date Value Ref Range Status  05/09/2021 85 >59 mL/min/1.73 Final         Passed - Patient is not pregnant      Passed - Valid encounter within last 12 months    Recent Outpatient Visits           4 weeks ago Urinary frequency   [facility], [place], [facility]   2 months ago Mixed hyperlipidemia   [facility], [facility]   4 months ago Type 2 diabetes mellitus without complication, without long-term current use of insulin ([facility])   [facility], [facility]   6 months ago Type 2 diabetes mellitus without complication, without long-term current  use of insulin ([facility])   [facility], [facility]   7 months ago Type 2 diabetes mellitus without complication, without long-term current use of insulin ([facility])   [facility], [place], [facility]       Future Appointments             In 1 week [facility], [facility]   In 1 month [facility], [facility], [facility]

## 2022-02-05 ENCOUNTER — Ambulatory Visit: Payer: BC Managed Care – PPO | Admitting: Family Medicine

## 2022-02-12 ENCOUNTER — Ambulatory Visit: Payer: BC Managed Care – PPO | Admitting: Family Medicine

## 2022-02-16 ENCOUNTER — Encounter: Payer: Self-pay | Admitting: Family Medicine

## 2022-02-16 ENCOUNTER — Ambulatory Visit (INDEPENDENT_AMBULATORY_CARE_PROVIDER_SITE_OTHER): Payer: BC Managed Care – PPO | Admitting: Family Medicine

## 2022-02-16 VITALS — BP 120/78 | HR 80 | Ht 67.0 in | Wt 322.0 lb

## 2022-02-16 DIAGNOSIS — E063 Autoimmune thyroiditis: Secondary | ICD-10-CM

## 2022-02-16 DIAGNOSIS — I10 Essential (primary) hypertension: Secondary | ICD-10-CM | POA: Diagnosis not present

## 2022-02-16 DIAGNOSIS — E038 Other specified hypothyroidism: Secondary | ICD-10-CM

## 2022-02-16 DIAGNOSIS — E119 Type 2 diabetes mellitus without complications: Secondary | ICD-10-CM | POA: Diagnosis not present

## 2022-02-16 MED ORDER — METFORMIN HCL ER 500 MG PO TB24: ORAL_TABLET | ORAL | 1 refills | Status: DC

## 2022-02-16 MED ORDER — LISINOPRIL 5 MG PO TABS: 5.0000 mg | ORAL_TABLET | Freq: Every day | ORAL | 1 refills | Status: DC

## 2022-02-16 MED ORDER — OZEMPIC (0.25 OR 0.5 MG/DOSE) 2 MG/3ML ~~LOC~~ SOPN: 0.5000 mg | PEN_INJECTOR | SUBCUTANEOUS | 3 refills | Status: DC

## 2022-02-16 NOTE — Progress Notes (Signed)
Date:  02/16/2022   Name:  Teresa Koch   DOB:  January 26, 1981   MRN:  [id number]   Chief Complaint: Diabetes, Hyperlipidemia, and Hypothyroidism  Diabetes She presents for her follow-up diabetic visit. She has type 2 diabetes mellitus. Her disease course has been improving. There are no hypoglycemic associated symptoms. Pertinent negatives for hypoglycemia include no dizziness, headaches or nervousness/anxiousness. There are no diabetic associated symptoms. Pertinent negatives for diabetes include no chest pain, no fatigue and no weakness. There are no hypoglycemic complications. Symptoms are stable. There are no diabetic complications. Pertinent negatives for diabetic complications include no CVA, PVD or retinopathy. There are no known risk factors for coronary artery disease. Current diabetic treatment includes diet and oral agent (monotherapy). She is compliant with treatment most of the time. Her weight is fluctuating minimally. She is following a generally healthy diet. Meal planning includes avoidance of concentrated sweets and carbohydrate counting. She participates in exercise intermittently. Her breakfast blood glucose is taken between 8-9 am. Her breakfast blood glucose range is generally 140-180 mg/dl. An ACE inhibitor/angiotensin II receptor blocker is being taken.  Hyperlipidemia This is a chronic problem. The current episode started more than 1 year ago. The problem is controlled. Recent lipid tests were reviewed and are normal. She has no history of chronic renal disease, diabetes or obesity. Pertinent negatives include no chest pain, myalgias or shortness of breath. Current antihyperlipidemic treatment includes statins. The current treatment provides moderate improvement of lipids. There are no compliance problems.  Risk factors for coronary artery disease include hypertension.  Hypertension This is a chronic problem. The current episode started more than 1 year ago. The problem has  been gradually improving since onset. The problem is controlled. Pertinent negatives include no chest pain, headaches, orthopnea, palpitations, peripheral edema, PND or shortness of breath. There are no associated agents to hypertension. Risk factors for coronary artery disease include dyslipidemia. Past treatments include ACE inhibitors. The current treatment provides moderate improvement. There are no compliance problems.  There is no history of angina, kidney disease, CAD/MI, CVA, heart failure, left ventricular hypertrophy, PVD or retinopathy. There is no history of chronic renal disease, a hypertension causing med or renovascular disease.    Lab Results  Component Value Date   NA 139 05/09/2021   K 4.7 05/09/2021   CO2 20 05/09/2021   GLUCOSE 179 (H) 05/09/2021   BUN 10 05/09/2021   CREATININE 0.88 05/09/2021   CALCIUM 9.8 05/09/2021   EGFR 85 05/09/2021   Lab Results  Component Value Date   CHOL 277 (H) 11/07/2021   HDL 41 11/07/2021   LDLCALC 191 (H) 11/07/2021   TRIG 235 (H) 11/07/2021   Lab Results  Component Value Date   TSH 2.780 05/09/2021   Lab Results  Component Value Date   HGBA1C 6.9 (H) 11/07/2021   No results found for: "WBC", "HGB", "HCT", "MCV", "PLT" No results found for: "ALT", "AST", "GGT", "ALKPHOS", "BILITOT" No results found for: "25OHVITD2", "25OHVITD3", "VD25OH"   Review of Systems  Constitutional: Negative.  Negative for chills, fatigue, fever and unexpected weight change.  HENT:  Negative for congestion, ear discharge, ear pain, rhinorrhea, sinus pressure, sneezing and sore throat.   Respiratory:  Negative for cough, shortness of breath, wheezing and stridor.   Cardiovascular:  Negative for chest pain, palpitations, orthopnea and PND.  Gastrointestinal:  Negative for abdominal pain, blood in stool, constipation, diarrhea and nausea.  Genitourinary:  Negative for dysuria, flank pain, frequency, hematuria, urgency and vaginal  discharge.   Musculoskeletal:  Negative for arthralgias, back pain and myalgias.  Skin:  Negative for rash.  Neurological:  Negative for dizziness, weakness and headaches.  Hematological:  Negative for adenopathy. Does not bruise/bleed easily.  Psychiatric/Behavioral:  Negative for dysphoric mood. The patient is not nervous/anxious.     Patient Active Problem List   Diagnosis Date Noted   Hashimoto's disease 07/03/2021   Primary osteoarthritis of left knee 10/29/2020   Tear of medial meniscus of right knee, current 10/29/2020   Lesion of bone of knee 10/29/2020    Allergies  Allergen Reactions   Oxycodone-Acetaminophen     Other reaction(s): Other Hallucinations and paranoia   Other     Other reaction(s): Other (See Comments) Percocet- Hallucinations    Penicillins Nausea And Vomiting, Other (See Comments) and Diarrhea    Migraines      Past Surgical History:  Procedure Laterality Date   CESAREAN SECTION     x1   ECTOPIC PREGNANCY SURGERY      Social History   Tobacco Use   Smoking status: Former    Years: 15.00    Types: Cigarettes    Quit date: 03/31/2015    Years since quitting: 6.8   Smokeless tobacco: Never  Vaping Use   Vaping Use: Never used  Substance Use Topics   Alcohol use: Yes    Comment: once a week   Drug use: Never     Medication list has been reviewed and updated.  Current Meds  Medication Sig   diclofenac (VOLTAREN) 75 MG EC tablet TAKE 1 TABLET BY MOUTH TWICE A DAY AS NEEDED   Insulin Pen Needle (PEN NEEDLES 3/16") 31G X 5 MM MISC 1 each by Does not apply route once a week.   levothyroxine (SYNTHROID) 175 MCG tablet TAKE 1 TABLET BY MOUTH EVERY DAY BEFORE BREAKFAST   lisinopril (ZESTRIL) 5 MG tablet TAKE 1 TABLET (5 MG TOTAL) BY MOUTH DAILY.   metFORMIN (GLUCOPHAGE-XR) 500 MG 24 hr tablet TAKE 1 TABLET BY MOUTH EVERY DAY WITH BREAKFAST   Semaglutide,0.25 or 0.5MG/DOS, (OZEMPIC, 0.25 OR 0.5 MG/DOSE,) 2 MG/3ML SOPN Inject 0.5 mg into the skin every 7  (seven) days.       02/16/2022    8:15 AM 01/05/2022    8:21 AM 11/07/2021   10:22 AM 07/31/2021    9:04 AM  GAD 7 : Generalized Anxiety Score  Nervous, Anxious, on Edge 0 0 0 0  Control/stop worrying 0 0 0 0  Worry too much - different things 0 0 0 1  Trouble relaxing 0 0 0 1  Restless 0 0 0 0  Easily annoyed or irritable 0 0 0 0  Afraid - awful might happen 0 0 0 0  Total GAD 7 Score 0 0 0 2  Anxiety Difficulty Not difficult at all Not difficult at all Not difficult at all Not difficult at all       02/16/2022    8:15 AM 01/05/2022    8:21 AM 11/07/2021   10:22 AM  Depression screen PHQ 2/9  Decreased Interest 0 0 0  Down, Depressed, Hopeless 0 0 0  PHQ - 2 Score 0 0 0  Altered sleeping 0 0 0  Tired, decreased energy 0 0 0  Change in appetite 0 0 0  Feeling bad or failure about yourself  0 0 0  Trouble concentrating 0 0 0  Moving slowly or fidgety/restless 0 0 0  Suicidal thoughts 0 0 0  PHQ-9 Score 0 0 0  Difficult doing work/chores Not difficult at all Not difficult at all Not difficult at all    BP Readings from Last 3 Encounters:  02/16/22 120/78  01/05/22 120/79  11/07/21 137/80    Physical Exam Vitals and nursing note reviewed. Exam conducted with a chaperone present.  Constitutional:      General: She is not in acute distress.    Appearance: She is not diaphoretic.  HENT:     Head: Normocephalic and atraumatic.     Right Ear: Tympanic membrane and external ear normal.     Left Ear: Tympanic membrane and external ear normal.     Nose: Nose normal.  Eyes:     General:        Right eye: No discharge.        Left eye: No discharge.     Conjunctiva/sclera: Conjunctivae normal.     Pupils: Pupils are equal, round, and reactive to light.  Neck:     Thyroid: No thyromegaly.     Vascular: No JVD.  Cardiovascular:     Rate and Rhythm: Normal rate and regular rhythm.     Pulses: Normal pulses.     Heart sounds: Normal heart sounds and S1 normal. No murmur  heard.    No systolic murmur is present.     No diastolic murmur is present.     No friction rub. No gallop. No S3 or S4 sounds.  Pulmonary:     Effort: Pulmonary effort is normal.     Breath sounds: Normal breath sounds.  Abdominal:     General: Bowel sounds are normal.     Palpations: Abdomen is soft. There is no mass.     Tenderness: There is no abdominal tenderness. There is no guarding.  Musculoskeletal:        General: Normal range of motion.     Cervical back: Full passive range of motion without pain.     Right lower leg: No edema.     Left lower leg: No edema.  Lymphadenopathy:     Cervical: No cervical adenopathy.  Skin:    General: Skin is warm and dry.  Neurological:     Mental Status: She is alert.     Wt Readings from Last 3 Encounters:  02/16/22 (!) 322 lb (146.1 kg)  01/05/22 (!) 326 lb (147.9 kg)  11/07/21 (!) 327 lb (148.3 kg)    BP 120/78   Pulse 80   Ht 5' 7" (1.702 m)   Wt (!) 322 lb (146.1 kg)   LMP 01/31/2022 (Approximate)   SpO2 99%   BMI 50.43 kg/m   Assessment and Plan:  1. Type 2 diabetes mellitus without complication, without long-term current use of insulin (HCC) Chronic.  Controlled.  Stable.  Currently on metformin XR 500 mg once a day and Ozempic 0.5 mg once a week.  We will check A1c to see level of control.  Fasting blood sugars have been in the 160 range, anticipating may be make an adjustment on Ozempic if still elevated.  We will also check her microalbuminuria/creatinine urine ratio for GFR. - metFORMIN (GLUCOPHAGE-XR) 500 MG 24 hr tablet; TAKE 1 TABLET BY MOUTH EVERY DAY WITH BREAKFAST  Dispense: 90 tablet; Refill: 1 - Semaglutide,0.25 or 0.5MG/DOS, (OZEMPIC, 0.25 OR 0.5 MG/DOSE,) 2 MG/3ML SOPN; Inject 0.5 mg into the skin every 7 (seven) days.  Dispense: 3 mL; Refill: 3 - Lipid Panel With LDL/HDL Ratio - HgB A1c - Microalbumin /  creatinine urine ratio  2. Primary hypertension Chronic.  Controlled.  Stable.  Blood pressure  120/78.  Was started on lisinopril 5 mg once a day.  Patient is doing well with this.  She is asymptomatic.  We will reemphasized low-sodium diet and we will recheck patient in 4 months.  We will likely continue with lisinopril 5 mg once a day. - lisinopril (ZESTRIL) 5 MG tablet; Take 1 tablet (5 mg total) by mouth daily.  Dispense: 90 tablet; Refill: 1  3. Hypothyroidism due to Hashimoto's thyroiditis .  Controlled.  Stable.  Will check TSH with thyroid panel to see if adequately compensated for hypothyroidism with current dosing of levothyroxine 175 mcg daily. - Thyroid Panel With TSH    Otilio Miu, MD

## 2022-02-17 LAB — LIPID PANEL WITH LDL/HDL RATIO
Cholesterol, Total: 227 mg/dL — ABNORMAL HIGH (ref 100–199)
HDL: 39 mg/dL — ABNORMAL LOW (ref >39)
LDL Chol Calc (NIH): 163 mg/dL — ABNORMAL HIGH (ref 0–99)
LDL/HDL Ratio: 4.2 ratio — ABNORMAL HIGH (ref 0.0–3.2)
Triglycerides: 136 mg/dL (ref 0–149)
VLDL Cholesterol Cal: 25 mg/dL (ref 5–40)

## 2022-02-17 LAB — THYROID PANEL WITH TSH
Free Thyroxine Index: 3 (ref 1.2–4.9)
T3 Uptake Ratio: 31 % (ref 24–39)
T4, Total: 9.8 ug/dL (ref 4.5–12.0)
TSH: 0.658 u[IU]/mL (ref 0.450–4.500)

## 2022-02-17 LAB — MICROALBUMIN / CREATININE URINE RATIO
Creatinine, Urine: 64.2 mg/dL
Microalb/Creat Ratio: <5 mg/g creat (ref 0–29)
Microalbumin, Urine: <3 ug/mL

## 2022-02-17 LAB — HEMOGLOBIN A1C
Est. average glucose Bld gHb Est-mCnc: 151 mg/dL
Hgb A1c MFr Bld: 6.9 % — ABNORMAL HIGH (ref 4.8–5.6)

## 2022-02-18 ENCOUNTER — Ambulatory Visit: Payer: Self-pay | Admitting: *Deleted

## 2022-02-18 NOTE — Telephone Encounter (Signed)
Reason for Disposition  [1] Follow-up call to recent contact AND [2] information only call, no triage required  Answer Assessment - Initial Assessment Questions 1. REASON FOR CALL or QUESTION: "What is your reason for calling today?" or "How can I best help you?" or "What question do you have that I can help answer?"     Pt returned call and was given lab result message from Dr. Elizabeth Sauer dated 02/18/2022 at 7:28 AM.  She wants to know if the Ozempic should be increased since her A1C did not change.   She's taking 0.5 mg now.  Protocols used: Information Only Call - No Triage-A-AH

## 2022-03-13 ENCOUNTER — Ambulatory Visit: Payer: BC Managed Care – PPO | Admitting: Family Medicine

## 2022-03-17 ENCOUNTER — Encounter: Payer: Self-pay | Admitting: Family Medicine

## 2022-03-18 ENCOUNTER — Ambulatory Visit (INDEPENDENT_AMBULATORY_CARE_PROVIDER_SITE_OTHER): Payer: BC Managed Care – PPO | Admitting: Family Medicine

## 2022-03-18 ENCOUNTER — Encounter: Payer: Self-pay | Admitting: Family Medicine

## 2022-03-18 VITALS — BP 126/78 | HR 88 | Ht 67.0 in | Wt 322.0 lb

## 2022-03-18 DIAGNOSIS — J101 Influenza due to other identified influenza virus with other respiratory manifestations: Secondary | ICD-10-CM

## 2022-03-18 DIAGNOSIS — R051 Acute cough: Secondary | ICD-10-CM

## 2022-03-18 LAB — POCT INFLUENZA A/B
Influenza A, POC: NEGATIVE
Influenza B, POC: NEGATIVE

## 2022-03-18 MED ORDER — OSELTAMIVIR PHOSPHATE 75 MG PO CAPS: 75.0000 mg | ORAL_CAPSULE | Freq: Two times a day (BID) | ORAL | 0 refills | Status: DC

## 2022-03-18 NOTE — Progress Notes (Signed)
Date:  03/18/2022   Name:  Teresa Koch   DOB:  [birth year]   MRN:  [id number]   Chief Complaint: Cough (Cong, cough, drainage)  Cough This is a new problem. Episode onset: 48 hr. The problem occurs every few minutes. The cough is Non-productive. Associated symptoms include postnasal drip, rhinorrhea and a sore throat. Pertinent negatives include no chest pain, chills, fever, myalgias, nasal congestion, shortness of breath or wheezing. She has tried nothing for the symptoms. The treatment provided moderate relief.    Lab Results  Component Value Date   NA 139 05/09/2021   K 4.7 05/09/2021   CO2 20 05/09/2021   GLUCOSE 179 (H) 05/09/2021   BUN 10 05/09/2021   CREATININE 0.88 05/09/2021   CALCIUM 9.8 05/09/2021   EGFR 85 05/09/2021   Lab Results  Component Value Date   CHOL 227 (H) 02/16/2022   HDL 39 (L) 02/16/2022   LDLCALC 163 (H) 02/16/2022   TRIG 136 02/16/2022   Lab Results  Component Value Date   TSH 0.658 02/16/2022   Lab Results  Component Value Date   HGBA1C 6.9 (H) 02/16/2022   No results found for: "WBC", "HGB", "HCT", "MCV", "PLT" No results found for: "ALT", "AST", "GGT", "ALKPHOS", "BILITOT" No results found for: "25OHVITD2", "25OHVITD3", "VD25OH"   Review of Systems  Constitutional:  Positive for fatigue. Negative for chills, diaphoresis and fever.  HENT:  Positive for postnasal drip, rhinorrhea and sore throat. Negative for sinus pressure, sinus pain and trouble swallowing.   Respiratory:  Positive for cough. Negative for chest tightness, shortness of breath and wheezing.   Cardiovascular:  Negative for chest pain and palpitations.  Gastrointestinal:  Negative for abdominal pain.  Genitourinary:  Negative for difficulty urinating.  Musculoskeletal:  Negative for back pain and myalgias.    Patient Active Problem List   Diagnosis Date Noted   Hashimoto's disease 07/03/2021   Primary osteoarthritis of left knee 10/29/2020   Tear of medial  meniscus of right knee, current 10/29/2020   Lesion of bone of knee 10/29/2020    Allergies  Allergen Reactions   Oxycodone-Acetaminophen     Other reaction(s): Other Hallucinations and paranoia   Other     Other reaction(s): Other (See Comments) Percocet- Hallucinations    Penicillins Nausea And Vomiting, Other (See Comments) and Diarrhea    Migraines      Past Surgical History:  Procedure Laterality Date   CESAREAN SECTION     x1   ECTOPIC PREGNANCY SURGERY      Social History   Tobacco Use   Smoking status: Former    Years: 15.00    Types: Cigarettes    Quit date: 03/31/2015    Years since quitting: 6.9   Smokeless tobacco: Never  Vaping Use   Vaping Use: Never used  Substance Use Topics   Alcohol use: Yes    Comment: once a week   Drug use: Never     Medication list has been reviewed and updated.  Current Meds  Medication Sig   diclofenac (VOLTAREN) 75 MG EC tablet TAKE 1 TABLET BY MOUTH TWICE A DAY AS NEEDED   Insulin Pen Needle (PEN NEEDLES 3/16") 31G X 5 MM MISC 1 each by Does not apply route once a week.   levothyroxine (SYNTHROID) 175 MCG tablet TAKE 1 TABLET BY MOUTH EVERY DAY BEFORE BREAKFAST   lisinopril (ZESTRIL) 5 MG tablet Take 1 tablet (5 mg total) by mouth daily.   metFORMIN (GLUCOPHAGE-XR) 500 MG  24 hr tablet TAKE 1 TABLET BY MOUTH EVERY DAY WITH BREAKFAST   Semaglutide,0.25 or 0.5MG/DOS, (OZEMPIC, 0.25 OR 0.5 MG/DOSE,) 2 MG/3ML SOPN Inject 0.5 mg into the skin every 7 (seven) days.       03/18/2022    8:11 AM 02/16/2022    8:15 AM 01/05/2022    8:21 AM 11/07/2021   10:22 AM  GAD 7 : Generalized Anxiety Score  Nervous, Anxious, on Edge 0 0 0 0  Control/stop worrying 0 0 0 0  Worry too much - different things 0 0 0 0  Trouble relaxing 0 0 0 0  Restless 0 0 0 0  Easily annoyed or irritable 0 0 0 0  Afraid - awful might happen 0 0 0 0  Total GAD 7 Score 0 0 0 0  Anxiety Difficulty Not difficult at all Not difficult at all Not difficult  at all Not difficult at all       03/18/2022    8:11 AM 02/16/2022    8:15 AM 01/05/2022    8:21 AM  Depression screen PHQ 2/9  Decreased Interest 0 0 0  Down, Depressed, Hopeless 0 0 0  PHQ - 2 Score 0 0 0  Altered sleeping 0 0 0  Tired, decreased energy 0 0 0  Change in appetite 0 0 0  Feeling bad or failure about yourself  0 0 0  Trouble concentrating 0 0 0  Moving slowly or fidgety/restless 0 0 0  Suicidal thoughts 0 0 0  PHQ-9 Score 0 0 0  Difficult doing work/chores Not difficult at all Not difficult at all Not difficult at all    BP Readings from Last 3 Encounters:  03/18/22 126/78  02/16/22 120/78  01/05/22 120/79    Physical Exam Vitals and nursing note reviewed. Exam conducted with a chaperone present.  Constitutional:      General: She is not in acute distress.    Appearance: She is not diaphoretic.  HENT:     Head: Normocephalic and atraumatic.     Right Ear: External ear normal.     Left Ear: External ear normal.     Nose: Nose normal. No congestion or rhinorrhea.     Mouth/Throat:     Mouth: Mucous membranes are moist.     Pharynx: Oropharynx is clear. No posterior oropharyngeal erythema.  Eyes:     General:        Right eye: No discharge.        Left eye: No discharge.     Conjunctiva/sclera: Conjunctivae normal.     Pupils: Pupils are equal, round, and reactive to light.  Neck:     Thyroid: No thyromegaly.     Vascular: No JVD.  Cardiovascular:     Rate and Rhythm: Normal rate and regular rhythm.     Heart sounds: Normal heart sounds. No murmur heard.    No friction rub. No gallop.  Pulmonary:     Effort: Pulmonary effort is normal.     Breath sounds: Normal breath sounds. No decreased breath sounds, wheezing, rhonchi or rales.  Abdominal:     General: Bowel sounds are normal.     Palpations: Abdomen is soft. There is no mass.     Tenderness: There is no abdominal tenderness. There is no guarding.  Musculoskeletal:        General: Normal  range of motion.     Cervical back: Normal range of motion and neck supple.  Lymphadenopathy:  Head:     Right side of head: No submandibular or tonsillar adenopathy.     Left side of head: No submandibular or tonsillar adenopathy.     Cervical: No cervical adenopathy.  Skin:    General: Skin is warm and dry.  Neurological:     Mental Status: She is alert.     Wt Readings from Last 3 Encounters:  03/18/22 (!) 322 lb (146.1 kg)  02/16/22 (!) 322 lb (146.1 kg)  01/05/22 (!) 326 lb (147.9 kg)    BP 126/78   Pulse 88   Ht _0  (1.702 m)   Wt (!) 322 lb (146.1 kg)   LMP 03/02/2022 (Approximate)   SpO2 99%   BMI 50.43 kg/m   Assessment and Plan:  1. Influenza A New onset.  Within 48 hours.  Nonproductive cough without fever.  Patient exposed to sun who tested positive for influenza A.  We will initiate Tamiflu 75 mg 1 twice a day for 5 days.  Mucinex DM has been suggested for cough as well as acetaminophen for myalgias.  2. Acute cough Patient with acute cough with exposure to influenza A and son.  Influenza AMB tested negative but given exposure we will cover for viral concern. - POCT Influenza A/B    Otilio Miu, MD

## 2022-03-24 ENCOUNTER — Ambulatory Visit: Payer: BC Managed Care – PPO | Admitting: Family Medicine

## 2022-06-17 ENCOUNTER — Ambulatory Visit: Payer: BC Managed Care – PPO | Admitting: Family Medicine

## 2022-06-18 ENCOUNTER — Ambulatory Visit (INDEPENDENT_AMBULATORY_CARE_PROVIDER_SITE_OTHER): Payer: BC Managed Care – PPO | Admitting: Family Medicine

## 2022-06-18 ENCOUNTER — Telehealth: Payer: Self-pay

## 2022-06-18 ENCOUNTER — Encounter: Payer: Self-pay | Admitting: Family Medicine

## 2022-06-18 VITALS — BP 120/76 | HR 88 | Ht 67.0 in | Wt 328.0 lb

## 2022-06-18 DIAGNOSIS — E782 Mixed hyperlipidemia: Secondary | ICD-10-CM | POA: Diagnosis not present

## 2022-06-18 DIAGNOSIS — E119 Type 2 diabetes mellitus without complications: Secondary | ICD-10-CM

## 2022-06-18 MED ORDER — METFORMIN HCL ER 500 MG PO TB24: ORAL_TABLET | ORAL | 1 refills | Status: DC

## 2022-06-18 NOTE — Progress Notes (Signed)
Date:  06/18/2022   Name:  Teresa Koch   DOB:  1981/02/24   MRN:  [id number]   Chief Complaint: Diabetes  Diabetes She presents for her follow-up diabetic visit. She has type 2 diabetes mellitus. Her disease course has been stable. There are no hypoglycemic associated symptoms. There are no diabetic associated symptoms. Pertinent negatives for diabetes include no blurred vision, no chest pain, no fatigue, no foot paresthesias, no foot ulcerations, no polydipsia, no polyphagia, no polyuria, no visual change, no weakness and no weight loss. There are no hypoglycemic complications. Symptoms are stable. There are no diabetic complications. There are no known risk factors for coronary artery disease. When asked about current treatments, none were reported. She is following a generally healthy diet. Meal planning includes avoidance of concentrated sweets and carbohydrate counting. She participates in exercise intermittently. Her breakfast blood glucose is taken between 8-9 am. Her breakfast blood glucose range is generally 140-180 mg/dl. An ACE inhibitor/angiotensin II receptor blocker is being taken.  Hyperlipidemia This is a chronic problem. The current episode started more than 1 year ago. The problem is uncontrolled. Exacerbating diseases include diabetes and hypothyroidism. Pertinent negatives include no chest pain or shortness of breath. There are no compliance problems.     Lab Results  Component Value Date   NA 139 05/09/2021   K 4.7 05/09/2021   CO2 20 05/09/2021   GLUCOSE 179 (H) 05/09/2021   BUN 10 05/09/2021   CREATININE 0.88 05/09/2021   CALCIUM 9.8 05/09/2021   EGFR 85 05/09/2021   Lab Results  Component Value Date   CHOL 227 (H) 02/16/2022   HDL 39 (L) 02/16/2022   LDLCALC 163 (H) 02/16/2022   TRIG 136 02/16/2022   Lab Results  Component Value Date   TSH 0.658 02/16/2022   Lab Results  Component Value Date   HGBA1C 6.9 (H) 02/16/2022   No results found for: "WBC",  "HGB", "HCT", "MCV", "PLT" No results found for: "ALT", "AST", "GGT", "ALKPHOS", "BILITOT" No results found for: "25OHVITD2", "25OHVITD3", "VD25OH"   Review of Systems  Constitutional:  Negative for diaphoresis, fatigue, unexpected weight change and weight loss.  HENT:  Negative for trouble swallowing.   Eyes:  Negative for blurred vision.  Respiratory:  Negative for chest tightness, shortness of breath and wheezing.   Cardiovascular:  Negative for chest pain, palpitations and leg swelling.  Gastrointestinal:  Negative for abdominal distention, abdominal pain, blood in stool, nausea and vomiting.  Endocrine: Negative for polydipsia, polyphagia and polyuria.  Genitourinary:  Negative for difficulty urinating, menstrual problem and vaginal bleeding.  Skin:  Negative for color change.  Neurological:  Negative for weakness.  Hematological:  Negative for adenopathy. Does not bruise/bleed easily.    Patient Active Problem List   Diagnosis Date Noted   Hashimoto's disease 07/03/2021   Primary osteoarthritis of left knee 10/29/2020   Tear of medial meniscus of right knee, current 10/29/2020   Lesion of bone of knee 10/29/2020    Allergies  Allergen Reactions   Oxycodone-Acetaminophen     Other reaction(s): Other Hallucinations and paranoia   Other     Other reaction(s): Other (See Comments) Percocet- Hallucinations    Penicillins Nausea And Vomiting, Other (See Comments) and Diarrhea    Migraines      Past Surgical History:  Procedure Laterality Date   CESAREAN SECTION     x1   ECTOPIC PREGNANCY SURGERY      Social History   Tobacco Use   Smoking  status: Former    Years: 15    Types: Cigarettes    Quit date: 03/31/2015    Years since quitting: 7.2   Smokeless tobacco: Never  Vaping Use   Vaping Use: Never used  Substance Use Topics   Alcohol use: Yes    Comment: once a week   Drug use: Never     Medication list has been reviewed and updated.  Current Meds   Medication Sig   Insulin Pen Needle (PEN NEEDLES 3/16") 31G X 5 MM MISC 1 each by Does not apply route once a week.   levothyroxine (SYNTHROID) 175 MCG tablet TAKE 1 TABLET BY MOUTH EVERY DAY BEFORE BREAKFAST   lisinopril (ZESTRIL) 5 MG tablet Take 1 tablet (5 mg total) by mouth daily.   metFORMIN (GLUCOPHAGE-XR) 500 MG 24 hr tablet TAKE 1 TABLET BY MOUTH EVERY DAY WITH BREAKFAST   Semaglutide,0.25 or 0.5MG /DOS, (OZEMPIC, 0.25 OR 0.5 MG/DOSE,) 2 MG/3ML SOPN Inject 0.5 mg into the skin every 7 (seven) days.   [DISCONTINUED] oseltamivir (TAMIFLU) 75 MG capsule Take 1 capsule (75 mg total) by mouth 2 (two) times daily.       06/18/2022    8:09 AM 03/18/2022    8:11 AM 02/16/2022    8:15 AM 01/05/2022    8:21 AM  GAD 7 : Generalized Anxiety Score  Nervous, Anxious, on Edge 0 0 0 0  Control/stop worrying 0 0 0 0  Worry too much - different things 0 0 0 0  Trouble relaxing 0 0 0 0  Restless 0 0 0 0  Easily annoyed or irritable 0 0 0 0  Afraid - awful might happen 0 0 0 0  Total GAD 7 Score 0 0 0 0  Anxiety Difficulty Not difficult at all Not difficult at all Not difficult at all Not difficult at all       06/18/2022    8:08 AM 03/18/2022    8:11 AM 02/16/2022    8:15 AM  Depression screen PHQ 2/9  Decreased Interest 0 0 0  Down, Depressed, Hopeless 0 0 0  PHQ - 2 Score 0 0 0  Altered sleeping 0 0 0  Tired, decreased energy 0 0 0  Change in appetite 0 0 0  Feeling bad or failure about yourself  0 0 0  Trouble concentrating 0 0 0  Moving slowly or fidgety/restless 0 0 0  Suicidal thoughts 0 0 0  PHQ-9 Score 0 0 0  Difficult doing work/chores Not difficult at all Not difficult at all Not difficult at all    BP Readings from Last 3 Encounters:  06/18/22 120/76  03/18/22 126/78  02/16/22 120/78    Physical Exam Vitals and nursing note reviewed. Exam conducted with a chaperone present.  Constitutional:      General: She is not in acute distress.    Appearance: She is not  diaphoretic.  HENT:     Head: Normocephalic and atraumatic.     Right Ear: Tympanic membrane and external ear normal.     Left Ear: Tympanic membrane and external ear normal.     Nose: Nose normal.  Eyes:     General:        Right eye: No discharge.        Left eye: No discharge.     Conjunctiva/sclera: Conjunctivae normal.     Pupils: Pupils are equal, round, and reactive to light.  Neck:     Thyroid: No thyromegaly.  Vascular: No JVD.  Cardiovascular:     Rate and Rhythm: Normal rate and regular rhythm.     Heart sounds: Normal heart sounds. No murmur heard.    No friction rub. No gallop.  Pulmonary:     Effort: Pulmonary effort is normal.     Breath sounds: Normal breath sounds. No wheezing, rhonchi or rales.  Abdominal:     General: Bowel sounds are normal.     Palpations: Abdomen is soft. There is no hepatomegaly, splenomegaly or mass.     Tenderness: There is no abdominal tenderness. There is no guarding.  Musculoskeletal:        General: Normal range of motion.     Cervical back: Normal range of motion and neck supple.  Lymphadenopathy:     Cervical: No cervical adenopathy.  Skin:    General: Skin is warm and dry.  Neurological:     Mental Status: She is alert.     Deep Tendon Reflexes: Reflexes are normal and symmetric.     Reflex Scores:      Patellar reflexes are 2+ on the right side and 2+ on the left side.    Wt Readings from Last 3 Encounters:  06/18/22 (!) 328 lb (148.8 kg)  03/18/22 (!) 322 lb (146.1 kg)  02/16/22 (!) 322 lb (146.1 kg)    BP 120/76   Pulse 88   Ht 5\' 7"  (1.702 m)   Wt (!) 328 lb (148.8 kg)   LMP 06/11/2022 (Approximate)   SpO2 99%   BMI 51.37 kg/m   Assessment and Plan: 1. Type 2 diabetes mellitus without complication, without long-term current use of insulin (HCC) Chronic.  Can hold.  Stable.  Currently on metformin XR 500 mg once a day and Ozempic 0.5 mg once a week.  Will check A1c and microalbuminuria and if not in  well-controlled range may consider going up to 1 mg/week. - metFORMIN (GLUCOPHAGE-XR) 500 MG 24 hr tablet; TAKE 1 TABLET BY MOUTH EVERY DAY WITH BREAKFAST  Dispense: 90 tablet; Refill: 1 - HgB A1c - Microalbumin / creatinine urine ratio  2. Mixed hyperlipidemia Chronic.  Controlled.  Stable.  Will recheck LDL for current elevated reading last visit patient is fasting this morning and we will check lipid panel. - Lipid Panel With LDL/HDL Ratio     Otilio Miu, MD

## 2022-06-21 LAB — MICROALBUMIN / CREATININE URINE RATIO
Creatinine, Urine: 95.9 mg/dL
Microalb/Creat Ratio: <3 mg/g creat (ref 0–29)
Microalbumin, Urine: <3 ug/mL

## 2022-06-21 LAB — HEMOGLOBIN A1C
Est. average glucose Bld gHb Est-mCnc: 163 mg/dL
Hgb A1c MFr Bld: 7.3 % — ABNORMAL HIGH (ref 4.8–5.6)

## 2022-06-21 LAB — LIPID PANEL WITH LDL/HDL RATIO
Cholesterol, Total: 269 mg/dL — ABNORMAL HIGH (ref 100–199)
HDL: 43 mg/dL (ref >39)
LDL Chol Calc (NIH): 185 mg/dL — ABNORMAL HIGH (ref 0–99)
LDL/HDL Ratio: 4.3 ratio — ABNORMAL HIGH (ref 0.0–3.2)
Triglycerides: 215 mg/dL — ABNORMAL HIGH (ref 0–149)
VLDL Cholesterol Cal: 41 mg/dL — ABNORMAL HIGH (ref 5–40)

## 2022-06-22 ENCOUNTER — Other Ambulatory Visit: Payer: Self-pay

## 2022-06-22 DIAGNOSIS — E119 Type 2 diabetes mellitus without complications: Secondary | ICD-10-CM

## 2022-06-22 DIAGNOSIS — E782 Mixed hyperlipidemia: Secondary | ICD-10-CM

## 2022-06-22 MED ORDER — ATORVASTATIN CALCIUM 10 MG PO TABS: 10.0000 mg | ORAL_TABLET | Freq: Every day | ORAL | 1 refills | Status: DC

## 2022-06-22 MED ORDER — OZEMPIC (0.25 OR 0.5 MG/DOSE) 2 MG/3ML ~~LOC~~ SOPN: 1.0000 mg | PEN_INJECTOR | SUBCUTANEOUS | 3 refills | Status: DC

## 2022-06-22 NOTE — Telephone Encounter (Signed)
error 

## 2022-06-23 ENCOUNTER — Other Ambulatory Visit: Payer: Self-pay

## 2022-06-23 ENCOUNTER — Encounter: Payer: Self-pay | Admitting: Family Medicine

## 2022-06-23 DIAGNOSIS — E119 Type 2 diabetes mellitus without complications: Secondary | ICD-10-CM

## 2022-06-23 MED ORDER — OZEMPIC (0.25 OR 0.5 MG/DOSE) 2 MG/3ML ~~LOC~~ SOPN: 1.0000 mg | PEN_INJECTOR | SUBCUTANEOUS | 1 refills | Status: DC

## 2022-07-03 ENCOUNTER — Other Ambulatory Visit: Payer: Self-pay

## 2022-07-03 DIAGNOSIS — E119 Type 2 diabetes mellitus without complications: Secondary | ICD-10-CM

## 2022-07-03 MED ORDER — SEMAGLUTIDE (1 MG/DOSE) 4 MG/3ML ~~LOC~~ SOPN: 1.0000 mg | PEN_INJECTOR | SUBCUTANEOUS | 2 refills | Status: DC

## 2022-07-26 ENCOUNTER — Other Ambulatory Visit: Payer: Self-pay | Admitting: Family Medicine

## 2022-07-26 DIAGNOSIS — E782 Mixed hyperlipidemia: Secondary | ICD-10-CM

## 2022-09-24 ENCOUNTER — Other Ambulatory Visit: Payer: Self-pay | Admitting: Family Medicine

## 2022-09-24 DIAGNOSIS — E038 Other specified hypothyroidism: Secondary | ICD-10-CM

## 2022-09-24 DIAGNOSIS — E119 Type 2 diabetes mellitus without complications: Secondary | ICD-10-CM

## 2022-10-19 ENCOUNTER — Encounter: Payer: Self-pay | Admitting: Family Medicine

## 2022-10-19 ENCOUNTER — Ambulatory Visit: Payer: BC Managed Care – PPO | Admitting: Family Medicine

## 2022-10-19 VITALS — BP 120/62 | HR 86 | Ht 67.0 in | Wt 316.0 lb

## 2022-10-19 DIAGNOSIS — E038 Other specified hypothyroidism: Secondary | ICD-10-CM | POA: Diagnosis not present

## 2022-10-19 DIAGNOSIS — Z7984 Long term (current) use of oral hypoglycemic drugs: Secondary | ICD-10-CM | POA: Diagnosis not present

## 2022-10-19 DIAGNOSIS — E119 Type 2 diabetes mellitus without complications: Secondary | ICD-10-CM

## 2022-10-19 DIAGNOSIS — E063 Autoimmune thyroiditis: Secondary | ICD-10-CM

## 2022-10-19 DIAGNOSIS — Z7985 Long-term (current) use of injectable non-insulin antidiabetic drugs: Secondary | ICD-10-CM | POA: Diagnosis not present

## 2022-10-19 MED ORDER — LEVOTHYROXINE SODIUM 175 MCG PO TABS
ORAL_TABLET | ORAL | 1 refills | Status: DC
Start: 2022-10-19 — End: 2023-03-18

## 2022-10-19 MED ORDER — METFORMIN HCL ER 500 MG PO TB24: ORAL_TABLET | ORAL | 1 refills | Status: DC

## 2022-10-19 MED ORDER — OZEMPIC (1 MG/DOSE) 4 MG/3ML ~~LOC~~ SOPN
1.0000 mg | PEN_INJECTOR | SUBCUTANEOUS | 3 refills | Status: DC
Start: 2022-10-19 — End: 2023-03-18

## 2022-10-19 MED ORDER — OZEMPIC (1 MG/DOSE) 4 MG/3ML ~~LOC~~ SOPN: 1.0000 mg | PEN_INJECTOR | SUBCUTANEOUS | 0 refills | Status: DC

## 2022-10-19 NOTE — Progress Notes (Signed)
Date:  10/19/2022   Name:  Teresa Koch   DOB:  07-10-1980   MRN:  [id number]   Chief Complaint: Diabetes and Hypothyroidism  Diabetes She presents for her follow-up diabetic visit. She has type 2 diabetes mellitus. Her disease course has been stable. There are no hypoglycemic associated symptoms. Pertinent negatives for hypoglycemia include no headaches, nervousness/anxiousness or sleepiness. Pertinent negatives for diabetes include no blurred vision, no chest pain, no fatigue, no foot paresthesias, no foot ulcerations, no polydipsia, no polyuria, no visual change, no weakness and no weight loss. There are no hypoglycemic complications. Symptoms are improving. There are no diabetic complications. Risk factors for coronary artery disease include dyslipidemia and hypertension. Current diabetic treatment includes oral agent (monotherapy) (noninsulin injectable). She is following a generally healthy diet. Meal planning includes avoidance of concentrated sweets and carbohydrate counting. She has not had a previous visit with a dietitian. She participates in exercise intermittently. Her home blood glucose trend is fluctuating minimally. Her breakfast blood glucose range is generally 140-180 mg/dl. An ACE inhibitor/angiotensin II receptor blocker is being taken.  Thyroid Problem Presents for follow-up visit. Patient reports no anxiety, cold intolerance, constipation, depressed mood, diarrhea, dry skin, fatigue, hair loss, heat intolerance, menstrual problem, nail problem, palpitations, visual change or weight loss. The symptoms have been stable.    Lab Results  Component Value Date   NA 139 05/09/2021   K 4.7 05/09/2021   CO2 20 05/09/2021   GLUCOSE 179 (H) 05/09/2021   BUN 10 05/09/2021   CREATININE 0.88 05/09/2021   CALCIUM 9.8 05/09/2021   EGFR 85 05/09/2021   Lab Results  Component Value Date   CHOL 269 (H) 06/18/2022   HDL 43 06/18/2022   LDLCALC 185 (H) 06/18/2022   TRIG 215 (H)  06/18/2022   Lab Results  Component Value Date   TSH 0.658 02/16/2022   Lab Results  Component Value Date   HGBA1C 7.3 (H) 06/18/2022   No results found for: "WBC", "HGB", "HCT", "MCV", "PLT" No results found for: "ALT", "AST", "GGT", "ALKPHOS", "BILITOT" No results found for: "25OHVITD2", "25OHVITD3", "VD25OH"   Review of Systems  Constitutional:  Negative for fatigue and weight loss.  HENT:  Negative for trouble swallowing.   Eyes:  Negative for blurred vision and visual disturbance.  Respiratory:  Negative for chest tightness, shortness of breath and wheezing.   Cardiovascular:  Negative for chest pain and palpitations.  Gastrointestinal:  Negative for abdominal pain, blood in stool, constipation and diarrhea.  Endocrine: Negative for cold intolerance, heat intolerance, polydipsia and polyuria.  Genitourinary:  Negative for difficulty urinating, menstrual problem and vaginal discharge.  Neurological:  Negative for weakness and headaches.  Hematological:  Negative for adenopathy. Does not bruise/bleed easily.  Psychiatric/Behavioral:  The patient is not nervous/anxious.     Patient Active Problem List   Diagnosis Date Noted   Hashimoto's disease 07/03/2021   Primary osteoarthritis of left knee 10/29/2020   Tear of medial meniscus of right knee, current 10/29/2020   Lesion of bone of knee 10/29/2020    Allergies  Allergen Reactions   Oxycodone-Acetaminophen     Other reaction(s): Other Hallucinations and paranoia   Other     Other reaction(s): Other (See Comments) Percocet- Hallucinations    Penicillins Nausea And Vomiting, Other (See Comments) and Diarrhea    Migraines      Past Surgical History:  Procedure Laterality Date   CESAREAN SECTION     x1   ECTOPIC PREGNANCY SURGERY  Social History   Tobacco Use   Smoking status: Former    Current packs/day: 0.00    Types: Cigarettes    Start date: 03/30/2000    Quit date: 03/31/2015    Years since quitting:  7.5   Smokeless tobacco: Never  Vaping Use   Vaping status: Never Used  Substance Use Topics   Alcohol use: Yes    Comment: once a week   Drug use: Never     Medication list has been reviewed and updated.  Current Meds  Medication Sig   atorvastatin (LIPITOR) 10 MG tablet TAKE 1 TABLET BY MOUTH EVERY DAY   Insulin Pen Needle (PEN NEEDLES 3/16") 31G X 5 MM MISC 1 each by Does not apply route once a week.   levothyroxine (SYNTHROID) 175 MCG tablet TAKE 1 TABLET BY MOUTH EVERY DAY BEFORE BREAKFAST   lisinopril (ZESTRIL) 5 MG tablet Take 1 tablet (5 mg total) by mouth daily.   metFORMIN (GLUCOPHAGE-XR) 500 MG 24 hr tablet TAKE 1 TABLET BY MOUTH EVERY DAY WITH BREAKFAST   Semaglutide, 1 MG/DOSE, (OZEMPIC, 1 MG/DOSE,) 4 MG/3ML SOPN INJECT 1 MG ONCE A WEEK AS DIRECTED       10/19/2022    8:39 AM 06/18/2022    8:09 AM 03/18/2022    8:11 AM 02/16/2022    8:15 AM  GAD 7 : Generalized Anxiety Score  Nervous, Anxious, on Edge 0 0 0 0  Control/stop worrying 0 0 0 0  Worry too much - different things 0 0 0 0  Trouble relaxing 0 0 0 0  Restless 0 0 0 0  Easily annoyed or irritable 0 0 0 0  Afraid - awful might happen 0 0 0 0  Total GAD 7 Score 0 0 0 0  Anxiety Difficulty Not difficult at all Not difficult at all Not difficult at all Not difficult at all       10/19/2022    8:39 AM 06/18/2022    8:08 AM 03/18/2022    8:11 AM  Depression screen PHQ 2/9  Decreased Interest 0 0 0  Down, Depressed, Hopeless 0 0 0  PHQ - 2 Score 0 0 0  Altered sleeping 0 0 0  Tired, decreased energy 0 0 0  Change in appetite 0 0 0  Feeling bad or failure about yourself  0 0 0  Trouble concentrating 0 0 0  Moving slowly or fidgety/restless 0 0 0  Suicidal thoughts 0 0 0  PHQ-9 Score 0 0 0  Difficult doing work/chores Not difficult at all Not difficult at all Not difficult at all    BP Readings from Last 3 Encounters:  10/19/22 120/62  06/18/22 120/76  03/18/22 126/78    Physical Exam Vitals  and nursing note reviewed. Exam conducted with a chaperone present.  Constitutional:      General: She is not in acute distress.    Appearance: She is not diaphoretic.  HENT:     Head: Normocephalic and atraumatic.     Right Ear: External ear normal.     Left Ear: External ear normal.     Nose: Nose normal.     Mouth/Throat:     Mouth: Mucous membranes are moist.  Eyes:     General:        Right eye: No discharge.        Left eye: No discharge.     Conjunctiva/sclera: Conjunctivae normal.     Pupils: Pupils are equal, round, and reactive  to light.  Neck:     Thyroid: No thyromegaly.     Vascular: No JVD.  Cardiovascular:     Rate and Rhythm: Normal rate and regular rhythm.     Heart sounds: Normal heart sounds, S1 normal and S2 normal. No murmur heard.    No systolic murmur is present.     No diastolic murmur is present.     No friction rub. No gallop. No S3 or S4 sounds.  Pulmonary:     Effort: Pulmonary effort is normal.     Breath sounds: Normal breath sounds. No wheezing, rhonchi or rales.  Abdominal:     General: Bowel sounds are normal.     Palpations: Abdomen is soft. There is no mass.     Tenderness: There is no abdominal tenderness. There is no guarding or rebound.  Musculoskeletal:        General: Normal range of motion.     Cervical back: Normal range of motion and neck supple.  Lymphadenopathy:     Cervical: No cervical adenopathy.  Skin:    General: Skin is warm and dry.  Neurological:     Mental Status: She is alert.     Deep Tendon Reflexes: Reflexes are normal and symmetric.     Reflex Scores:      Patellar reflexes are 2+ on the right side and 2+ on the left side.    Wt Readings from Last 3 Encounters:  10/19/22 (!) 316 lb (143.3 kg)  06/18/22 (!) 328 lb (148.8 kg)  03/18/22 (!) 322 lb (146.1 kg)    BP 120/62   Pulse 86   Ht 5\' 7"  (1.702 m)   Wt (!) 316 lb (143.3 kg)   LMP 09/29/2022 (Approximate)   SpO2 94%   BMI 49.49 kg/m   Assessment  and Plan: 1. Hypothyroidism due to Hashimoto's thyroiditis Chronic.  Controlled.  Stable.  Asymptomatic.  Tolerating current dose of levothyroxine.  Continue levothyroxine 175 mcg pending TSH measurement. - TSH - levothyroxine (SYNTHROID) 175 MCG tablet; TAKE 1 TABLET BY MOUTH EVERY DAY BEFORE BREAKFAST  Dispense: 90 tablet; Refill: 1  2. Long-term current use of injectable noninsulin antidiabetic medication Chronic.  Controlled.  Stable.  Tolerating semaglutide well.  Currently is on 1 mg once a week.  Will check A1c and microalbuminuria.  Will continue for 4 months. - Semaglutide, 1 MG/DOSE, (OZEMPIC, 1 MG/DOSE,) 4 MG/3ML SOPN; Inject 1 mg as directed once a week.  Dispense: 3 mL; Refill: 3 - HgB A1c - Microalbumin / creatinine urine ratio  3. Diabetes mellitus treated with oral medication (HCC) Chronic.  Controlled.  Stable.  Continue metformin XR 500 mg once a day.  Will check A1c for current level of control. - metFORMIN (GLUCOPHAGE-XR) 500 MG 24 hr tablet; TAKE 1 TABLET BY MOUTH EVERY DAY WITH BREAKFAST  Dispense: 90 tablet; Refill: 1 - HgB A1c - Microalbumin / creatinine urine ratio        Elizabeth Sauer, MD

## 2022-10-20 LAB — HEMOGLOBIN A1C
Est. average glucose Bld gHb Est-mCnc: 140 mg/dL
Hgb A1c MFr Bld: 6.5 % — ABNORMAL HIGH (ref 4.8–5.6)

## 2022-10-20 LAB — MICROALBUMIN / CREATININE URINE RATIO
Creatinine, Urine: 64.4 mg/dL
Microalb/Creat Ratio: <5 mg/g creat (ref 0–29)
Microalbumin, Urine: <3 ug/mL

## 2022-10-20 LAB — TSH: TSH: 0.348 u[IU]/mL — ABNORMAL LOW (ref 0.450–4.500)

## 2022-10-21 ENCOUNTER — Other Ambulatory Visit: Payer: Self-pay

## 2022-10-21 MED ORDER — LEVOTHYROXINE SODIUM 150 MCG PO TABS: 150.0000 ug | ORAL_TABLET | Freq: Every day | ORAL | 1 refills | Status: DC

## 2022-10-27 ENCOUNTER — Other Ambulatory Visit: Payer: Self-pay | Admitting: Family Medicine

## 2022-10-27 DIAGNOSIS — E782 Mixed hyperlipidemia: Secondary | ICD-10-CM

## 2022-10-27 DIAGNOSIS — I10 Essential (primary) hypertension: Secondary | ICD-10-CM

## 2022-10-27 NOTE — Telephone Encounter (Signed)
Requested by interface surescripts. Last labs 06/18/22. Future visit in 3 months. Requested Prescriptions  Pending Prescriptions Disp Refills   atorvastatin (LIPITOR) 10 MG tablet [Pharmacy Med Name: ATORVASTATIN 10 MG TABLET] 90 tablet 0    Sig: TAKE 1 TABLET BY MOUTH EVERY DAY     Cardiovascular:  Antilipid - Statins Failed - 10/27/2022  8:22 AM      Failed - Lipid Panel in normal range within the last 12 months    Cholesterol, Total  Date Value Ref Range Status  06/18/2022 269 (H) 100 - 199 mg/dL Final   LDL Chol Calc (NIH)  Date Value Ref Range Status  06/18/2022 185 (H) 0 - 99 mg/dL Final   HDL  Date Value Ref Range Status  06/18/2022 43 >39 mg/dL Final   Triglycerides  Date Value Ref Range Status  06/18/2022 215 (H) 0 - 149 mg/dL Final         Passed - Patient is not pregnant      Passed - Valid encounter within last 12 months    Recent Outpatient Visits           1 week ago Hypothyroidism due to Hashimoto's thyroiditis   [place] [facility], [place]   4 months ago Type 2 diabetes mellitus without complication, without long-term current use of insulin (HCC)   [place] [facility], [facility]   7 months ago Influenza A   [facility], [place]   8 months ago Type 2 diabetes mellitus without complication, without long-term current use of insulin (HCC)   [place] [facility], MD   9 months ago Urinary frequency   [place] [facility], MD       Future Appointments             In 3 months Duanne Limerick, MD [facility]             lisinopril (ZESTRIL) 5 MG tablet [Pharmacy Med Name: LISINOPRIL 5 MG TABLET] 90 tablet 1    Sig: TAKE  1 TABLET (5 MG TOTAL) BY MOUTH DAILY.     Cardiovascular:  ACE Inhibitors Failed - 10/27/2022  8:22 AM      Failed - Cr in normal range and within 180 days    Creatinine, Ser  Date Value Ref Range Status  05/09/2021 0.88 0.57 - 1.00 mg/dL Final         Failed - K in normal range and within 180 days    Potassium  Date Value Ref Range Status  05/09/2021 4.7 3.5 - 5.2 mmol/L Final         Passed - Patient is not pregnant      Passed - Last BP in normal range    BP Readings from Last 1 Encounters:  10/19/22 120/62         Passed - Valid encounter within last 6 months    Recent Outpatient Visits           1 week ago Hypothyroidism due to Hashimoto's thyroiditis   [place] [facility], MD   4 months ago Type 2 diabetes mellitus  without complication, without long-term current use of insulin (HCC)   [place] [facility], MD   7 months ago Influenza A   [facility] at [facility], [place]   8 months ago Type 2 diabetes mellitus without complication, without long-term current use of insulin (HCC)   [place] [facility], MD   9 months ago Urinary frequency   [place] [facility], MD       Future Appointments             In 3 months Duanne Limerick, MD [facility] at [facility], [facility]

## 2022-10-27 NOTE — Telephone Encounter (Signed)
Requested medication (s) are due for refill today: yes  Requested medication (s) are on the active medication list: yes  Last refill:  02/16/22 #90 1 refill  Future visit scheduled: yes in 3 months  Notes to clinic:  protocol failed  last labs 05/09/21. Do you want to refill Rx?     Requested Prescriptions  Pending Prescriptions Disp Refills   lisinopril (ZESTRIL) 5 MG tablet [Pharmacy Med Name: LISINOPRIL 5 MG TABLET] 90 tablet 1    Sig: TAKE 1 TABLET (5 MG TOTAL) BY MOUTH DAILY.     Cardiovascular:  ACE Inhibitors Failed - 10/27/2022  8:22 AM      Failed - Cr in normal range and within 180 days    Creatinine, Ser  Date Value Ref Range Status  05/09/2021 0.88 0.57 - 1.00 mg/dL Final         Failed - K in normal range and within 180 days    Potassium  Date Value Ref Range Status  05/09/2021 4.7 3.5 - 5.2 mmol/L Final         Passed - Patient is not pregnant      Passed - Last BP in normal range    BP Readings from Last 1 Encounters:  10/19/22 120/62         Passed - Valid encounter within last 6 months    Recent Outpatient Visits           1 week ago Hypothyroidism due to Hashimoto's thyroiditis   [place] [facility], MD   4 months ago Type 2 diabetes mellitus without complication, without long-term current use of insulin (HCC)   [place] [facility], MD   7 months ago Influenza A   [place] [facility] at [facility], MD   8 months ago Type 2 diabetes mellitus without complication, without long-term current use of insulin (HCC)   [place] [facility], MD   9 months ago Urinary frequency   [place] [facility] at [facility], MD       Future Appointments             In 3 months Duanne Limerick, MD [facility] at [facility], [facility]            Signed Prescriptions Disp Refills   atorvastatin (LIPITOR) 10 MG tablet 90 tablet 0    Sig: TAKE 1 TABLET BY MOUTH EVERY DAY     Cardiovascular:  Antilipid - Statins Failed - 10/27/2022  8:22 AM      Failed - Lipid Panel in normal range within the last 12 months    Cholesterol, Total  Date Value Ref Range Status  06/18/2022 269 (H) 100 - 199 mg/dL Final   LDL Chol Calc (NIH)  Date Value Ref Range Status  06/18/2022 185 (H) 0 - 99 mg/dL Final   HDL  Date Value Ref Range Status  06/18/2022 43 >39 mg/dL Final   Triglycerides  Date Value Ref Range Status  06/18/2022 215 (H) 0 - 149 mg/dL Final         Passed - Patient is not pregnant      Passed - Valid encounter within last 12 months    Recent Outpatient Visits  1 week ago Hypothyroidism due to Hashimoto's thyroiditis   [place] [facility] at [facility], MD   4 months ago Type 2 diabetes mellitus without complication, without long-term current use of insulin (HCC)   [place] [facility], MD   7 months ago Influenza A   [facility] at [facility], MD   8 months ago Type 2 diabetes mellitus without complication, without long-term current use of insulin (HCC)   [place] [facility], MD   9 months ago Urinary frequency   [place] [facility], MD       Future Appointments             In 3 months Duanne Limerick, MD [facility] at [facility]

## 2022-12-29 ENCOUNTER — Encounter: Payer: Self-pay | Admitting: Family Medicine

## 2023-01-04 ENCOUNTER — Ambulatory Visit: Payer: BC Managed Care – PPO | Admitting: Family Medicine

## 2023-01-07 ENCOUNTER — Encounter: Payer: Self-pay | Admitting: Family Medicine

## 2023-01-07 ENCOUNTER — Ambulatory Visit: Payer: BC Managed Care – PPO | Admitting: Family Medicine

## 2023-01-07 VITALS — BP 114/78 | HR 90 | Ht 67.0 in | Wt 319.0 lb

## 2023-01-07 DIAGNOSIS — F410 Panic disorder [episodic paroxysmal anxiety] without agoraphobia: Secondary | ICD-10-CM

## 2023-01-07 DIAGNOSIS — R0789 Other chest pain: Secondary | ICD-10-CM | POA: Diagnosis not present

## 2023-01-07 DIAGNOSIS — F411 Generalized anxiety disorder: Secondary | ICD-10-CM

## 2023-01-07 MED ORDER — SERTRALINE HCL 25 MG PO TABS: 25.0000 mg | ORAL_TABLET | Freq: Every day | ORAL | 3 refills | Status: DC

## 2023-01-07 MED ORDER — ALPRAZOLAM 0.25 MG PO TABS: 0.2500 mg | ORAL_TABLET | Freq: Two times a day (BID) | ORAL | 0 refills | Status: DC | PRN

## 2023-01-07 NOTE — Progress Notes (Signed)
Date:  01/07/2023   Name:  Teresa Koch   DOB:  1980/07/11   MRN:  [id number]   Chief Complaint: Anxiety  Anxiety Presents for follow-up visit. Symptoms include chest pain, excessive worry, nervous/anxious behavior, palpitations, panic and shortness of breath. Patient reports no compulsions, confusion, decreased concentration, depressed mood, dizziness, dry mouth, feeling of choking, hyperventilation, impotence, insomnia, irritability, malaise, muscle tension, obsessions, restlessness or suicidal ideas. Symptoms occur occasionally. The severity of symptoms is mild.    Chest Pain  This is a new problem. The current episode started 1 to 4 weeks ago. The onset quality is sudden. The problem occurs intermittently. The problem has been waxing and waning. The pain is present in the substernal region. The pain is at a severity of 9/10. The pain is moderate. The quality of the pain is described as tightness. The pain does not radiate. Associated symptoms include palpitations and shortness of breath. Pertinent negatives include no abdominal pain, cough or dizziness. Associated with: driving. Treatments tried: pull over side of road.    Lab Results  Component Value Date   NA 139 05/09/2021   K 4.7 05/09/2021   CO2 20 05/09/2021   GLUCOSE 179 (H) 05/09/2021   BUN 10 05/09/2021   CREATININE 0.88 05/09/2021   CALCIUM 9.8 05/09/2021   EGFR 85 05/09/2021   Lab Results  Component Value Date   CHOL 269 (H) 06/18/2022   HDL 43 06/18/2022   LDLCALC 185 (H) 06/18/2022   TRIG 215 (H) 06/18/2022   Lab Results  Component Value Date   TSH 0.348 (L) 10/19/2022   Lab Results  Component Value Date   HGBA1C 6.5 (H) 10/19/2022   No results found for: "WBC", "HGB", "HCT", "MCV", "PLT" No results found for: "ALT", "AST", "GGT", "ALKPHOS", "BILITOT" No results found for: "25OHVITD2", "25OHVITD3", "VD25OH"   Review of Systems  Constitutional:  Negative for irritability.  HENT:  Negative for  congestion.   Respiratory:  Positive for chest tightness and shortness of breath. Negative for cough, choking, wheezing and stridor.   Cardiovascular:  Positive for chest pain and palpitations. Negative for leg swelling.  Gastrointestinal:  Negative for abdominal pain.  Endocrine: Negative for cold intolerance and heat intolerance.  Genitourinary:  Negative for impotence.  Neurological:  Positive for light-headedness. Negative for dizziness.  Psychiatric/Behavioral:  Negative for confusion, decreased concentration and suicidal ideas. The patient is nervous/anxious. The patient does not have insomnia.     Patient Active Problem List   Diagnosis Date Noted   Hashimoto's disease 07/03/2021   Primary osteoarthritis of left knee 10/29/2020   Tear of medial meniscus of right knee, current 10/29/2020   Lesion of bone of knee 10/29/2020    Allergies  Allergen Reactions   Oxycodone-Acetaminophen     Other reaction(s): Other Hallucinations and paranoia   Other     Other reaction(s): Other (See Comments) Percocet- Hallucinations    Penicillins Nausea And Vomiting, Other (See Comments) and Diarrhea    Migraines      Past Surgical History:  Procedure Laterality Date   CESAREAN SECTION     x1   ECTOPIC PREGNANCY SURGERY      Social History   Tobacco Use   Smoking status: Former    Current packs/day: 0.00    Types: Cigarettes    Start date: 03/30/2000    Quit date: 03/31/2015    Years since quitting: 7.7   Smokeless tobacco: Never  Vaping Use   Vaping status: Never Used  Substance Use Topics   Alcohol use: Yes    Comment: once a week   Drug use: Never     Medication list has been reviewed and updated.  Current Meds  Medication Sig   atorvastatin (LIPITOR) 10 MG tablet TAKE 1 TABLET BY MOUTH EVERY DAY   diclofenac (VOLTAREN) 75 MG EC tablet TAKE 1 TABLET BY MOUTH TWICE A DAY AS NEEDED   Insulin Pen Needle (PEN NEEDLES 3/16") 31G X 5 MM MISC 1 each by Does not apply route  once a week.   levothyroxine (SYNTHROID) 150 MCG tablet Take 1 tablet (150 mcg total) by mouth daily.   levothyroxine (SYNTHROID) 175 MCG tablet TAKE 1 TABLET BY MOUTH EVERY DAY BEFORE BREAKFAST   lisinopril (ZESTRIL) 5 MG tablet TAKE 1 TABLET (5 MG TOTAL) BY MOUTH DAILY.   metFORMIN (GLUCOPHAGE-XR) 500 MG 24 hr tablet TAKE 1 TABLET BY MOUTH EVERY DAY WITH BREAKFAST   Semaglutide, 1 MG/DOSE, (OZEMPIC, 1 MG/DOSE,) 4 MG/3ML SOPN Inject 1 mg as directed once a week.       01/07/2023    2:25 PM 10/19/2022    8:39 AM 06/18/2022    8:09 AM 03/18/2022    8:11 AM  GAD 7 : Generalized Anxiety Score  Nervous, Anxious, on Edge 3 0 0 0  Control/stop worrying 3 0 0 0  Worry too much - different things 0 0 0 0  Trouble relaxing 0 0 0 0  Restless 0 0 0 0  Easily annoyed or irritable 0 0 0 0  Afraid - awful might happen 3 0 0 0  Total GAD 7 Score 9 0 0 0  Anxiety Difficulty Extremely difficult Not difficult at all Not difficult at all Not difficult at all       01/07/2023    2:24 PM 10/19/2022    8:39 AM 06/18/2022    8:08 AM  Depression screen PHQ 2/9  Decreased Interest 0 0 0  Down, Depressed, Hopeless 0 0 0  PHQ - 2 Score 0 0 0  Altered sleeping 0 0 0  Tired, decreased energy 0 0 0  Change in appetite 0 0 0  Feeling bad or failure about yourself  0 0 0  Trouble concentrating 1 0 0  Moving slowly or fidgety/restless 0 0 0  Suicidal thoughts 0 0 0  PHQ-9 Score 1 0 0  Difficult doing work/chores Not difficult at all Not difficult at all Not difficult at all    BP Readings from Last 3 Encounters:  01/07/23 114/78  10/19/22 120/62  06/18/22 120/76    Physical Exam Vitals and nursing note reviewed. Exam conducted with a chaperone present.  Constitutional:      General: She is not in acute distress.    Appearance: She is not diaphoretic.  HENT:     Head: Normocephalic and atraumatic.     Right Ear: Tympanic membrane and external ear normal.     Left Ear: Tympanic membrane and  external ear normal.     Nose: Nose normal.     Mouth/Throat:     Mouth: Mucous membranes are moist.  Eyes:     General:        Right eye: No discharge.        Left eye: No discharge.     Conjunctiva/sclera: Conjunctivae normal.     Pupils: Pupils are equal, round, and reactive to light.  Neck:     Thyroid: No thyromegaly.     Vascular: No JVD.  Cardiovascular:     Rate and Rhythm: Normal rate and regular rhythm.     Heart sounds: Normal heart sounds. No murmur heard.    No friction rub. No gallop.  Pulmonary:     Effort: Pulmonary effort is normal.     Breath sounds: Normal breath sounds. No wheezing, rhonchi or rales.  Abdominal:     General: Bowel sounds are normal.     Palpations: Abdomen is soft. There is no mass.     Tenderness: There is no abdominal tenderness. There is no guarding.  Musculoskeletal:        General: Normal range of motion.     Cervical back: Normal range of motion and neck supple.  Lymphadenopathy:     Cervical: No cervical adenopathy.  Skin:    General: Skin is warm and dry.  Neurological:     Mental Status: She is alert.     Deep Tendon Reflexes: Reflexes are normal and symmetric.     Wt Readings from Last 3 Encounters:  01/07/23 (!) 319 lb (144.7 kg)  10/19/22 (!) 316 lb (143.3 kg)  06/18/22 (!) 328 lb (148.8 kg)    BP 114/78   Pulse 90   Ht 5\' 7"  (1.702 m)   Wt (!) 319 lb (144.7 kg)   SpO2 96%   BMI 49.96 kg/m   Assessment and Plan: 1. Chest tightness New onset.  Episodic.  It occurs only with anxiety and while driving.  Patient states that it is tightness in nature substernal and radiates to the back.  Explained to patient that chest tightness is going to need to be addressed as a possible cardiac issue prior to assuming that it is associated due to a panic attack.  Patient does have risk factors such as weight concern, elevated LDL, but blood pressure last today reading is 114/78.  EKG was done with the following results rate is 84  and is read as sinus rhythm.  Low voltage probably reflects upper chest size and other than an RS are V1 diagnostic nonspecific T wave abnormality there is no ST-T wave changes.  There is no suggestion of LVH by voltage criteria and no ischemic changes such as Q waves ST-T wave elevation depression nor delay in R wave progression.  I do not think that the chest tightness is cardiac in origin but if this continues patient needs to bring it to my attention and we will do further evaluation with cardiology consult. - EKG 12-Lead  2. Generalized anxiety disorder with panic attacks New onset from the standpoint of panic attacks being involved to the point that patient is unable to drive and is relieved only by pulling off the side of the road this is concerning is that the patient has had access to alprazolam that her friend has given her and would like to have some with driving until she can get settled with psychiatry GAD score today was read as 9.  Therefore we will place ambulatory referral to psychiatry and patient has been given a list to call multiple psychiatric offices to see if she can get in with her insurance and will notify us and we will proceed with a more focused referral I will though initiate sertraline at 25 mg daily and I will recheck patient in 4 to 6 weeks to see how she is tolerating this in the meantime I have agreed to put her on low-dose alprazolam to cautiously take when driving with with caution due to somnolence then she  is not to take more than 0.25 and if necessary may break it in half if seems to have been needed I am uncertain as to the dosing that she is taking now she seems to be able to tolerate the current dose that she is able to secure from her friend. - ALPRAZolam (XANAX) 0.25 MG tablet; Take 1 tablet (0.25 mg total) by mouth 2 (two) times daily as needed for anxiety.  Dispense: 20 tablet; Refill: 0 - sertraline (ZOLOFT) 25 MG tablet; Take 1 tablet (25 mg total) by mouth  daily.  Dispense: 30 tablet; Refill: 3 - Ambulatory referral to Psychiatry     Elizabeth Sauer, MD

## 2023-01-22 ENCOUNTER — Other Ambulatory Visit: Payer: Self-pay | Admitting: Family Medicine

## 2023-01-22 DIAGNOSIS — E782 Mixed hyperlipidemia: Secondary | ICD-10-CM

## 2023-01-22 DIAGNOSIS — I10 Essential (primary) hypertension: Secondary | ICD-10-CM

## 2023-01-31 ENCOUNTER — Other Ambulatory Visit: Payer: Self-pay | Admitting: Family Medicine

## 2023-01-31 DIAGNOSIS — F410 Panic disorder [episodic paroxysmal anxiety] without agoraphobia: Secondary | ICD-10-CM

## 2023-02-02 ENCOUNTER — Ambulatory Visit: Payer: BC Managed Care – PPO | Admitting: Family Medicine

## 2023-02-02 ENCOUNTER — Encounter: Payer: Self-pay | Admitting: Family Medicine

## 2023-02-02 VITALS — BP 110/72 | HR 80 | Ht 67.0 in | Wt 316.0 lb

## 2023-02-02 DIAGNOSIS — N6459 Other signs and symptoms in breast: Secondary | ICD-10-CM

## 2023-02-02 DIAGNOSIS — F411 Generalized anxiety disorder: Secondary | ICD-10-CM

## 2023-02-02 DIAGNOSIS — R0789 Other chest pain: Secondary | ICD-10-CM

## 2023-02-02 DIAGNOSIS — F410 Panic disorder [episodic paroxysmal anxiety] without agoraphobia: Secondary | ICD-10-CM | POA: Diagnosis not present

## 2023-02-02 MED ORDER — SERTRALINE HCL 50 MG PO TABS: 50.0000 mg | ORAL_TABLET | Freq: Every day | ORAL | 3 refills | Status: DC

## 2023-02-02 NOTE — Progress Notes (Signed)
Date:  02/02/2023   Name:  Shaolin Armas   DOB:  Dec 27, 1980   MRN:  [id number]   Chief Complaint: Anxiety and Chest Pain (Only when anxious )  Anxiety Presents for follow-up visit. Symptoms include chest pain, excessive worry, nervous/anxious behavior and restlessness. Patient reports no dizziness, nausea, palpitations or shortness of breath. Symptoms occur most days (episodic). The severity of symptoms is moderate (particularly while driving). The quality of sleep is fair.    Chest Pain  This is a new problem. The current episode started more than 1 month ago. The problem occurs daily. The problem has been unchanged. The pain is present in the substernal region. The pain is at a severity of 5/10. The pain is moderate. The quality of the pain is described as tightness. The pain does not radiate. Associated symptoms include diaphoresis. Pertinent negatives include no dizziness, exertional chest pressure, nausea, palpitations or shortness of breath. Associated symptoms comments: Anxiety provoked.    Lab Results  Component Value Date   NA 139 05/09/2021   K 4.7 05/09/2021   CO2 20 05/09/2021   GLUCOSE 179 (H) 05/09/2021   BUN 10 05/09/2021   CREATININE 0.88 05/09/2021   CALCIUM 9.8 05/09/2021   EGFR 85 05/09/2021   Lab Results  Component Value Date   CHOL 269 (H) 06/18/2022   HDL 43 06/18/2022   LDLCALC 185 (H) 06/18/2022   TRIG 215 (H) 06/18/2022   Lab Results  Component Value Date   TSH 0.348 (L) 10/19/2022   Lab Results  Component Value Date   HGBA1C 6.5 (H) 10/19/2022   No results found for: "WBC", "HGB", "HCT", "MCV", "PLT" No results found for: "ALT", "AST", "GGT", "ALKPHOS", "BILITOT" No results found for: "25OHVITD2", "25OHVITD3", "VD25OH"   Review of Systems  Constitutional:  Positive for diaphoresis.  HENT:  Negative for trouble swallowing.   Eyes:  Negative for visual disturbance.  Respiratory:  Positive for chest tightness. Negative for shortness of breath  and wheezing.   Cardiovascular:  Positive for chest pain. Negative for palpitations.  Gastrointestinal:  Negative for blood in stool and nausea.  Neurological:  Negative for dizziness.  Psychiatric/Behavioral:  The patient is nervous/anxious.     Patient Active Problem List   Diagnosis Date Noted   Hashimoto's disease 07/03/2021   Primary osteoarthritis of left knee 10/29/2020   Tear of medial meniscus of right knee, current 10/29/2020   Lesion of bone of knee 10/29/2020    Allergies  Allergen Reactions   Oxycodone-Acetaminophen     Other reaction(s): Other Hallucinations and paranoia   Other     Other reaction(s): Other (See Comments) Percocet- Hallucinations    Penicillins Nausea And Vomiting, Other (See Comments) and Diarrhea    Migraines      Past Surgical History:  Procedure Laterality Date   CESAREAN SECTION     x1   ECTOPIC PREGNANCY SURGERY      Social History   Tobacco Use   Smoking status: Former    Current packs/day: 0.00    Types: Cigarettes    Start date: 03/30/2000    Quit date: 03/31/2015    Years since quitting: 7.8   Smokeless tobacco: Never  Vaping Use   Vaping status: Never Used  Substance Use Topics   Alcohol use: Yes    Comment: once a week   Drug use: Never     Medication list has been reviewed and updated.  Current Meds  Medication Sig   ALPRAZolam (XANAX) 0.25  MG tablet Take 1 tablet (0.25 mg total) by mouth 2 (two) times daily as needed for anxiety.   atorvastatin (LIPITOR) 10 MG tablet TAKE 1 TABLET BY MOUTH EVERY DAY   diclofenac (VOLTAREN) 75 MG EC tablet TAKE 1 TABLET BY MOUTH TWICE A DAY AS NEEDED   Insulin Pen Needle (PEN NEEDLES 3/16") 31G X 5 MM MISC 1 each by Does not apply route once a week.   levothyroxine (SYNTHROID) 150 MCG tablet Take 1 tablet (150 mcg total) by mouth daily.   levothyroxine (SYNTHROID) 175 MCG tablet TAKE 1 TABLET BY MOUTH EVERY DAY BEFORE BREAKFAST   lisinopril (ZESTRIL) 5 MG tablet TAKE 1 TABLET (5  MG TOTAL) BY MOUTH DAILY.   metFORMIN (GLUCOPHAGE-XR) 500 MG 24 hr tablet TAKE 1 TABLET BY MOUTH EVERY DAY WITH BREAKFAST   Semaglutide, 1 MG/DOSE, (OZEMPIC, 1 MG/DOSE,) 4 MG/3ML SOPN Inject 1 mg as directed once a week.   sertraline (ZOLOFT) 25 MG tablet TAKE 1 TABLET (25 MG TOTAL) BY MOUTH DAILY.       02/02/2023    8:08 AM 01/07/2023    2:25 PM 10/19/2022    8:39 AM 06/18/2022    8:09 AM  GAD 7 : Generalized Anxiety Score  Nervous, Anxious, on Edge 3 3 0 0  Control/stop worrying 3 3 0 0  Worry too much - different things 1 0 0 0  Trouble relaxing 1 0 0 0  Restless 0 0 0 0  Easily annoyed or irritable 0 0 0 0  Afraid - awful might happen 3 3 0 0  Total GAD 7 Score 11 9 0 0  Anxiety Difficulty Not difficult at all Extremely difficult Not difficult at all Not difficult at all       02/02/2023    8:08 AM 01/07/2023    2:24 PM 10/19/2022    8:39 AM  Depression screen PHQ 2/9  Decreased Interest 0 0 0  Down, Depressed, Hopeless 0 0 0  PHQ - 2 Score 0 0 0  Altered sleeping 0 0 0  Tired, decreased energy 0 0 0  Change in appetite 0 0 0  Feeling bad or failure about yourself  1 0 0  Trouble concentrating 1 1 0  Moving slowly or fidgety/restless 0 0 0  Suicidal thoughts 0 0 0  PHQ-9 Score 2 1 0  Difficult doing work/chores Not difficult at all Not difficult at all Not difficult at all    BP Readings from Last 3 Encounters:  02/02/23 110/72  01/07/23 114/78  10/19/22 120/62    Physical Exam Vitals and nursing note reviewed. Exam conducted with a chaperone present.  Constitutional:      General: She is not in acute distress.    Appearance: She is not diaphoretic.  HENT:     Head: Normocephalic and atraumatic.     Right Ear: External ear normal.     Left Ear: External ear normal.     Nose: Nose normal.  Eyes:     General:        Right eye: No discharge.        Left eye: No discharge.     Conjunctiva/sclera: Conjunctivae normal.     Pupils: Pupils are equal, round,  and reactive to light.  Neck:     Thyroid: No thyromegaly.     Vascular: No JVD.  Cardiovascular:     Rate and Rhythm: Normal rate and regular rhythm.     Heart sounds: Normal heart sounds.  No murmur heard.    No friction rub. No gallop.  Pulmonary:     Effort: Pulmonary effort is normal.     Breath sounds: Normal breath sounds.  Abdominal:     General: Bowel sounds are normal.     Palpations: Abdomen is soft. There is no mass.     Tenderness: There is no abdominal tenderness. There is no guarding.  Musculoskeletal:     Cervical back: Normal range of motion and neck supple.  Lymphadenopathy:     Cervical: No cervical adenopathy.  Skin:    General: Skin is warm and dry.  Neurological:     Deep Tendon Reflexes: Reflexes are normal and symmetric.     Reflex Scores:      Patellar reflexes are 2+ on the right side and 2+ on the left side.    Wt Readings from Last 3 Encounters:  02/02/23 (!) 316 lb (143.3 kg)  01/07/23 (!) 319 lb (144.7 kg)  10/19/22 (!) 316 lb (143.3 kg)    BP 110/72   Pulse 80   Ht 5\' 7"  (1.702 m)   Wt (!) 316 lb (143.3 kg)   BMI 49.49 kg/m   Assessment and Plan: 1. Chest tightness New onset.  Episodic.  Relatively stable.  Review of previous EKG was normal.  Patient continues to have episodes of tightness mostly when she is under anxiety provoking situations.  This is described as tightness substernal nonradiating not associated with any other symptoms except perhaps diaphoresis.  Patient does have risk factors and since this is been continuous since initiating sertraline and that she has been using alprazolam on an as-needed basis while driving we will refer to cardiology for formal evaluation.  In the meantime if pain does continue or becomes significantly increased recommendations have been made to go to the emergency room of choice for urgent evaluation of chest pain including enzymes and repeat EKG. - Ambulatory referral to Cardiology  2. Generalized  anxiety disorder with panic attacks Perhaps chronic in nature however this has become more of an issue most recently.  GAD score has gone from 9-11 since the initiation of sertraline 25 mg and benzodiazepine at low-dose while driving.  Patient has tolerated the sertraline well and we will increase to 50 mg on a daily basis and patient will start by doubling up on her 25 mg all at 1 time.  Referral has been placed with psychiatry and she has been unable to secure psychiatric evaluation - Ambulatory referral to Psychiatry - sertraline (ZOLOFT) 50 MG tablet; Take 1 tablet (50 mg total) by mouth daily.  Dispense: 30 tablet; Refill: 3  3. Abnormal breast exam Patient is noted a palpable density in the left breast outer aspects at about 1:00 and I have instructed her to call her GYN if she is unable to get in with GYN for formal evaluation and referral for diagnostic mammogram she is to return here for my examination and we will proceed.    Elizabeth Sauer, MD

## 2023-02-14 ENCOUNTER — Other Ambulatory Visit: Payer: Self-pay | Admitting: Family Medicine

## 2023-02-14 DIAGNOSIS — I10 Essential (primary) hypertension: Secondary | ICD-10-CM

## 2023-02-14 DIAGNOSIS — E782 Mixed hyperlipidemia: Secondary | ICD-10-CM

## 2023-02-15 NOTE — Telephone Encounter (Signed)
Requested medication (s) are due for refill today: yes  Requested medication (s) are on the active medication list:yes  Last refill:  atorvastatin 01/22/23             lisinopril: 01/22/23  Future visit scheduled:yes  Notes to clinic:  pharmacy asking for 90 day refills   Requested Prescriptions  Pending Prescriptions Disp Refills   atorvastatin (LIPITOR) 10 MG tablet [Pharmacy Med Name: ATORVASTATIN 10 MG TABLET] 90 tablet 1    Sig: TAKE 1 TABLET BY MOUTH EVERY DAY     Cardiovascular:  Antilipid - Statins Failed - 02/14/2023 11:32 AM      Failed - Lipid Panel in normal range within the last 12 months    Cholesterol, Total  Date Value Ref Range Status  06/18/2022 269 (H) 100 - 199 mg/dL Final   LDL Chol Calc (NIH)  Date Value Ref Range Status  06/18/2022 185 (H) 0 - 99 mg/dL Final   HDL  Date Value Ref Range Status  06/18/2022 43 >39 mg/dL Final   Triglycerides  Date Value Ref Range Status  06/18/2022 215 (H) 0 - 149 mg/dL Final         Passed - Patient is not pregnant      Passed - Valid encounter within last 12 months    Recent Outpatient Visits           1 week ago Chest tightness   [place] [facility], [facility]   1 month ago Chest tightness   [facility]   3 months ago Hypothyroidism due to Hashimoto's thyroiditis   [place] [facility], MD   8 months ago Type 2 diabetes mellitus without complication, without long-term current use of insulin (HCC)   [place] [facility]   11 months ago Influenza A   [place] [facility]       Future Appointments             In 1 month Duanne Limerick, MD [facility], [facility]              lisinopril (ZESTRIL) 5 MG tablet [Pharmacy Med Name: LISINOPRIL 5 MG TABLET] 90 tablet 1    Sig: TAKE 1 TABLET (5 MG TOTAL) BY MOUTH DAILY.     Cardiovascular:  ACE Inhibitors Failed - 02/14/2023 11:32 AM      Failed - Cr in normal range and within 180 days    Creatinine, Ser  Date Value Ref Range Status  05/09/2021 0.88 0.57 - 1.00 mg/dL Final         Failed - K in normal range and within 180 days    Potassium  Date Value Ref Range Status  05/09/2021 4.7 3.5 - 5.2 mmol/L Final         Passed - Patient is not pregnant      Passed - Last BP in normal range    BP Readings from Last 1 Encounters:  02/02/23 110/72         Passed - Valid encounter within last 6 months    Recent Outpatient Visits           1 week  ago Chest tightness   [place] [facility]   1 month ago Chest tightness   [facility]   3 months ago Hypothyroidism due to Hashimoto's thyroiditis   [place] [facility], MD   8 months ago Type 2 diabetes mellitus without complication, without long-term current use of insulin (HCC)    [facility]   11 months ago Influenza A   [facility], [facility]       Future Appointments             In 1 month Duanne Limerick, MD [facility] at [facility], [facility]

## 2023-02-18 ENCOUNTER — Other Ambulatory Visit: Payer: Self-pay | Admitting: Family Medicine

## 2023-02-18 DIAGNOSIS — I10 Essential (primary) hypertension: Secondary | ICD-10-CM

## 2023-02-18 DIAGNOSIS — E782 Mixed hyperlipidemia: Secondary | ICD-10-CM

## 2023-02-19 DIAGNOSIS — R0789 Other chest pain: Secondary | ICD-10-CM | POA: Diagnosis not present

## 2023-03-09 ENCOUNTER — Other Ambulatory Visit: Payer: Self-pay | Admitting: Family Medicine

## 2023-03-09 DIAGNOSIS — F410 Panic disorder [episodic paroxysmal anxiety] without agoraphobia: Secondary | ICD-10-CM

## 2023-03-17 ENCOUNTER — Other Ambulatory Visit: Payer: Self-pay | Admitting: Family Medicine

## 2023-03-17 ENCOUNTER — Other Ambulatory Visit: Payer: Self-pay

## 2023-03-17 DIAGNOSIS — I10 Essential (primary) hypertension: Secondary | ICD-10-CM

## 2023-03-17 NOTE — Telephone Encounter (Signed)
Requested medication (s) are due for refill today: yes  Requested medication (s) are on the active medication list: yes  Last refill:  02/17/22 #30  Future visit scheduled: yes tomorrow  Notes to clinic:  overdue lab work    Requested Prescriptions  Pending Prescriptions Disp Refills   lisinopril (ZESTRIL) 5 MG tablet [Pharmacy Med Name: LISINOPRIL 5 MG TABLET] 90 tablet 1    Sig: TAKE 1 TABLET (5 MG TOTAL) BY MOUTH DAILY.     Cardiovascular:  ACE Inhibitors Failed - 03/17/2023  1:09 PM      Failed - Cr in normal range and within 180 days    Creatinine, Ser  Date Value Ref Range Status  05/09/2021 0.88 0.57 - 1.00 mg/dL Final         Failed - K in normal range and within 180 days    Potassium  Date Value Ref Range Status  05/09/2021 4.7 3.5 - 5.2 mmol/L Final         Passed - Patient is not pregnant      Passed - Last BP in normal range    BP Readings from Last 1 Encounters:  02/02/23 110/72         Passed - Valid encounter within last 6 months    Recent Outpatient Visits           1 month ago Chest tightness   [place] [facility]   2 months ago Chest tightness   [facility]   4 months ago Hypothyroidism due to Hashimoto's thyroiditis   [place] [facility], MD   9 months ago Type 2 diabetes mellitus without complication, without long-term current use of insulin (HCC)   [place] [facility], MD   12 months ago Influenza A   [facility], [facility]       Future Appointments             Tomorrow Duanne Limerick, MD [facility] at [facility], [facility]

## 2023-03-18 ENCOUNTER — Ambulatory Visit: Payer: BC Managed Care – PPO | Admitting: Family Medicine

## 2023-03-18 ENCOUNTER — Encounter: Payer: Self-pay | Admitting: Family Medicine

## 2023-03-18 VITALS — BP 128/78 | HR 81 | Ht 67.0 in | Wt 322.2 lb

## 2023-03-18 DIAGNOSIS — E782 Mixed hyperlipidemia: Secondary | ICD-10-CM

## 2023-03-18 DIAGNOSIS — F411 Generalized anxiety disorder: Secondary | ICD-10-CM

## 2023-03-18 DIAGNOSIS — E063 Autoimmune thyroiditis: Secondary | ICD-10-CM

## 2023-03-18 DIAGNOSIS — Z7984 Long term (current) use of oral hypoglycemic drugs: Secondary | ICD-10-CM

## 2023-03-18 DIAGNOSIS — E119 Type 2 diabetes mellitus without complications: Secondary | ICD-10-CM

## 2023-03-18 DIAGNOSIS — Z7985 Long-term (current) use of injectable non-insulin antidiabetic drugs: Secondary | ICD-10-CM | POA: Diagnosis not present

## 2023-03-18 DIAGNOSIS — F410 Panic disorder [episodic paroxysmal anxiety] without agoraphobia: Secondary | ICD-10-CM

## 2023-03-18 MED ORDER — SERTRALINE HCL 50 MG PO TABS: 50.0000 mg | ORAL_TABLET | Freq: Every day | ORAL | 1 refills | Status: DC

## 2023-03-18 MED ORDER — ALPRAZOLAM 0.25 MG PO TABS: 0.2500 mg | ORAL_TABLET | Freq: Two times a day (BID) | ORAL | 0 refills | Status: DC | PRN

## 2023-03-18 MED ORDER — OZEMPIC (1 MG/DOSE) 4 MG/3ML ~~LOC~~ SOPN: 1.0000 mg | PEN_INJECTOR | SUBCUTANEOUS | 3 refills | Status: DC

## 2023-03-18 MED ORDER — LEVOTHYROXINE SODIUM 150 MCG PO TABS: 150.0000 ug | ORAL_TABLET | Freq: Every day | ORAL | 1 refills | Status: DC

## 2023-03-18 MED ORDER — ATORVASTATIN CALCIUM 10 MG PO TABS: 10.0000 mg | ORAL_TABLET | Freq: Every day | ORAL | 1 refills | Status: DC

## 2023-03-18 MED ORDER — METFORMIN HCL ER 500 MG PO TB24: ORAL_TABLET | ORAL | 1 refills | Status: DC

## 2023-03-18 MED ORDER — LEVOTHYROXINE SODIUM 175 MCG PO TABS: ORAL_TABLET | ORAL | 1 refills | Status: DC

## 2023-03-18 NOTE — Progress Notes (Signed)
Date:  03/18/2023   Name:  Teresa Koch   DOB:  June 14, 1980   MRN:  [id number]   Chief Complaint: Hypothyroidism and Diabetes  Diabetes She presents for her follow-up diabetic visit. She has type 2 diabetes mellitus. Her disease course has been stable. Hypoglycemia symptoms include nervousness/anxiousness. Pertinent negatives for hypoglycemia include no headaches, sweats or tremors. There are no diabetic associated symptoms. Pertinent negatives for diabetes include no blurred vision, no chest pain, no visual change and no weight loss. There are no hypoglycemic complications. Symptoms are stable. There are no diabetic complications. Risk factors for coronary artery disease include diabetes mellitus, dyslipidemia and hypertension. She is following a generally healthy diet. Meal planning includes avoidance of concentrated sweets and carbohydrate counting. She has not had a previous visit with a dietitian. She participates in exercise intermittently. There is no change in her home blood glucose trend. An ACE inhibitor/angiotensin II receptor blocker is being taken.  Hypertension This is a chronic problem. The current episode started more than 1 year ago. The problem has been gradually improving since onset. The problem is controlled. Pertinent negatives include no anxiety, blurred vision, chest pain, headaches, malaise/fatigue, neck pain, orthopnea, palpitations, peripheral edema, PND, shortness of breath or sweats. Identifiable causes of hypertension include a thyroid problem.  Hyperlipidemia This is a chronic problem. The current episode started more than 1 year ago. The problem is controlled. Pertinent negatives include no chest pain, focal sensory loss, myalgias or shortness of breath. Current antihyperlipidemic treatment includes statins. The current treatment provides moderate improvement of lipids.  Thyroid Problem Presents for follow-up visit. Symptoms include anxiety. Patient reports no cold  intolerance, constipation, depressed mood, diaphoresis, diarrhea, hair loss, heat intolerance, hoarse voice, leg swelling, menstrual problem, nail problem, palpitations, tremors, visual change, weight gain or weight loss. The symptoms have been improving. Her past medical history is significant for hyperlipidemia.    Lab Results  Component Value Date   NA 139 05/09/2021   K 4.7 05/09/2021   CO2 20 05/09/2021   GLUCOSE 179 (H) 05/09/2021   BUN 10 05/09/2021   CREATININE 0.88 05/09/2021   CALCIUM 9.8 05/09/2021   EGFR 85 05/09/2021   Lab Results  Component Value Date   CHOL 269 (H) 06/18/2022   HDL 43 06/18/2022   LDLCALC 185 (H) 06/18/2022   TRIG 215 (H) 06/18/2022   Lab Results  Component Value Date   TSH 0.348 (L) 10/19/2022   Lab Results  Component Value Date   HGBA1C 6.5 (H) 10/19/2022   No results found for: "WBC", "HGB", "HCT", "MCV", "PLT" No results found for: "ALT", "AST", "GGT", "ALKPHOS", "BILITOT" No results found for: "25OHVITD2", "25OHVITD3", "VD25OH"   Review of Systems  Constitutional:  Negative for diaphoresis, malaise/fatigue, weight gain and weight loss.  HENT:  Negative for hoarse voice.   Eyes:  Negative for blurred vision.  Respiratory:  Negative for apnea and shortness of breath.   Cardiovascular:  Negative for chest pain, palpitations, orthopnea and PND.  Gastrointestinal:  Negative for constipation and diarrhea.  Endocrine: Negative for cold intolerance and heat intolerance.  Genitourinary:  Negative for menstrual problem.  Musculoskeletal:  Negative for myalgias and neck pain.  Neurological:  Negative for tremors and headaches.  Psychiatric/Behavioral:  The patient is nervous/anxious.     Patient Active Problem List   Diagnosis Date Noted   Hashimoto's disease 07/03/2021   Primary osteoarthritis of left knee 10/29/2020   Tear of medial meniscus of right knee, current 10/29/2020  Lesion of bone of knee 10/29/2020    Allergies  Allergen  Reactions   Oxycodone-Acetaminophen     Other reaction(s): Other Hallucinations and paranoia   Other     Other reaction(s): Other (See Comments) Percocet- Hallucinations    Penicillins Nausea And Vomiting, Other (See Comments) and Diarrhea    Migraines      Past Surgical History:  Procedure Laterality Date   CESAREAN SECTION     x1   ECTOPIC PREGNANCY SURGERY      Social History   Tobacco Use   Smoking status: Former    Current packs/day: 0.00    Types: Cigarettes    Start date: 03/30/2000    Quit date: 03/31/2015    Years since quitting: 7.9   Smokeless tobacco: Never  Vaping Use   Vaping status: Never Used  Substance Use Topics   Alcohol use: Yes    Comment: once a week   Drug use: Never     Medication list has been reviewed and updated.  Current Meds  Medication Sig   ALPRAZolam (XANAX) 0.25 MG tablet Take 1 tablet (0.25 mg total) by mouth 2 (two) times daily as needed for anxiety.   atorvastatin (LIPITOR) 10 MG tablet TAKE 1 TABLET BY MOUTH EVERY DAY   Insulin Pen Needle (PEN NEEDLES 3/16") 31G X 5 MM MISC 1 each by Does not apply route once a week.   levothyroxine (SYNTHROID) 150 MCG tablet Take 1 tablet (150 mcg total) by mouth daily.   levothyroxine (SYNTHROID) 175 MCG tablet TAKE 1 TABLET BY MOUTH EVERY DAY BEFORE BREAKFAST   lisinopril (ZESTRIL) 5 MG tablet TAKE 1 TABLET (5 MG TOTAL) BY MOUTH DAILY.   metFORMIN (GLUCOPHAGE-XR) 500 MG 24 hr tablet TAKE 1 TABLET BY MOUTH EVERY DAY WITH BREAKFAST   Semaglutide, 1 MG/DOSE, (OZEMPIC, 1 MG/DOSE,) 4 MG/3ML SOPN Inject 1 mg as directed once a week.   sertraline (ZOLOFT) 50 MG tablet TAKE 1 TABLET BY MOUTH EVERY DAY       03/18/2023    8:29 AM 02/02/2023    8:08 AM 01/07/2023    2:25 PM 10/19/2022    8:39 AM  GAD 7 : Generalized Anxiety Score  Nervous, Anxious, on Edge 3 3 3  0  Control/stop worrying 2 3 3  0  Worry too much - different things 1 1 0 0  Trouble relaxing 1 1 0 0  Restless 0 0 0 0  Easily annoyed  or irritable 0 0 0 0  Afraid - awful might happen 2 3 3  0  Total GAD 7 Score 9 11 9  0  Anxiety Difficulty Not difficult at all Not difficult at all Extremely difficult Not difficult at all       03/18/2023    8:29 AM 02/02/2023    8:08 AM 01/07/2023    2:24 PM  Depression screen PHQ 2/9  Decreased Interest 0 0 0  Down, Depressed, Hopeless 0 0 0  PHQ - 2 Score 0 0 0  Altered sleeping 0 0 0  Tired, decreased energy 2 0 0  Change in appetite 0 0 0  Feeling bad or failure about yourself  1 1 0  Trouble concentrating 2 1 1   Moving slowly or fidgety/restless 0 0 0  Suicidal thoughts 0 0 0  PHQ-9 Score 5 2 1   Difficult doing work/chores  Not difficult at all Not difficult at all    BP Readings from Last 3 Encounters:  03/18/23 128/78  02/02/23 110/72  01/07/23  114/78    Physical Exam Vitals and nursing note reviewed. Exam conducted with a chaperone present.  Constitutional:      General: She is not in acute distress.    Appearance: She is not diaphoretic.  HENT:     Head: Normocephalic and atraumatic.     Right Ear: External ear normal.     Left Ear: External ear normal.     Nose: Nose normal.  Eyes:     General:        Right eye: No discharge.        Left eye: No discharge.     Conjunctiva/sclera: Conjunctivae normal.     Pupils: Pupils are equal, round, and reactive to light.  Neck:     Thyroid: No thyromegaly.     Vascular: No JVD.  Cardiovascular:     Rate and Rhythm: Normal rate and regular rhythm.     Heart sounds: Normal heart sounds, S1 normal and S2 normal. No murmur heard.    No systolic murmur is present.     No diastolic murmur is present.     No friction rub. No gallop. No S3 or S4 sounds.  Pulmonary:     Effort: Pulmonary effort is normal.     Breath sounds: Normal breath sounds.  Abdominal:     General: Bowel sounds are normal.     Palpations: Abdomen is soft. There is no mass.     Tenderness: There is no abdominal tenderness. There is no  guarding.  Musculoskeletal:        General: Normal range of motion.     Cervical back: Normal range of motion and neck supple.     Right lower leg: No edema.     Left lower leg: No edema.  Lymphadenopathy:     Cervical: No cervical adenopathy.  Skin:    General: Skin is warm and dry.  Neurological:     Mental Status: She is alert.     Deep Tendon Reflexes: Reflexes are normal and symmetric.     Wt Readings from Last 3 Encounters:  03/18/23 (!) 322 lb 3.2 oz (146.1 kg)  02/02/23 (!) 316 lb (143.3 kg)  01/07/23 (!) 319 lb (144.7 kg)    BP 128/78   Pulse 81   Ht 5\' 7"  (1.702 m)   Wt (!) 322 lb 3.2 oz (146.1 kg)   SpO2 96%   BMI 50.46 kg/m   Assessment and Plan:  1. Diabetes mellitus treated with oral medication (HCC) (Primary) Chronic.  Controlled.  Stable.  Asymptomatic.  Patient tolerating medication well and will continue metformin XR 500 mg once a day.  Will check A1c for current level of control and CMP for electrolytes and GFR.  Will recheck patient in 4 months - metFORMIN (GLUCOPHAGE-XR) 500 MG 24 hr tablet; TAKE 1 TABLET BY MOUTH EVERY DAY WITH BREAKFAST  Dispense: 90 tablet; Refill: 1 - Hemoglobin A1c - Comprehensive metabolic panel  2. Long-term current use of injectable noninsulin antidiabetic medication Chronic.  Controlled.  Stable.  Patient tolerating semaglutide 1 mg/week.  Will check A1c for level of control.Will continue sertraline 50 mg once a day and one-time refill 20 tablets of Xanax to patient can see her psychiatrist that is scheduled for February. - sertraline (ZOLOFT) 50 MG tablet; Take 1 tablet (50 mg total) by mouth daily.  Dispense: 90 tablet; Refill: 1 - ALPRAZolam (XANAX) 0.25 MG tablet; Take 1 tablet (0.25 mg total) by mouth 2 (two) times daily as needed  for anxiety.  Dispense: 20 tablet; Refill: 0    Elizabeth Sauer, MD

## 2023-03-19 ENCOUNTER — Encounter: Payer: Self-pay | Admitting: Family Medicine

## 2023-03-19 LAB — HEMOGLOBIN A1C
Est. average glucose Bld gHb Est-mCnc: 154 mg/dL
Hgb A1c MFr Bld: 7 % — ABNORMAL HIGH (ref 4.8–5.6)

## 2023-03-19 LAB — COMPREHENSIVE METABOLIC PANEL
ALT: 28 [IU]/L (ref 0–32)
AST: 20 [IU]/L (ref 0–40)
Albumin: 4.2 g/dL (ref 3.9–4.9)
Alkaline Phosphatase: 112 [IU]/L (ref 44–121)
BUN/Creatinine Ratio: 13 (ref 9–23)
BUN: 12 mg/dL (ref 6–24)
Bilirubin Total: 0.4 mg/dL (ref 0.0–1.2)
CO2: 20 mmol/L (ref 20–29)
Calcium: 9.4 mg/dL (ref 8.7–10.2)
Chloride: 102 mmol/L (ref 96–106)
Creatinine, Ser: 0.89 mg/dL (ref 0.57–1.00)
Globulin, Total: 2.6 g/dL (ref 1.5–4.5)
Glucose: 140 mg/dL — ABNORMAL HIGH (ref 70–99)
Potassium: 4.5 mmol/L (ref 3.5–5.2)
Sodium: 138 mmol/L (ref 134–144)
Total Protein: 6.8 g/dL (ref 6.0–8.5)
eGFR: 82 mL/min/{1.73_m2} (ref >59)

## 2023-03-19 LAB — LIPID PANEL WITH LDL/HDL RATIO
Cholesterol, Total: 201 mg/dL — ABNORMAL HIGH (ref 100–199)
HDL: 46 mg/dL (ref >39)
LDL Chol Calc (NIH): 128 mg/dL — ABNORMAL HIGH (ref 0–99)
LDL/HDL Ratio: 2.8 {ratio} (ref 0.0–3.2)
Triglycerides: 150 mg/dL — ABNORMAL HIGH (ref 0–149)
VLDL Cholesterol Cal: 27 mg/dL (ref 5–40)

## 2023-03-19 LAB — TSH: TSH: 1.27 u[IU]/mL (ref 0.450–4.500)

## 2023-03-19 NOTE — Patient Instructions (Signed)

## 2023-03-21 ENCOUNTER — Other Ambulatory Visit: Payer: Self-pay | Admitting: Family Medicine

## 2023-03-21 DIAGNOSIS — I10 Essential (primary) hypertension: Secondary | ICD-10-CM

## 2023-04-09 DIAGNOSIS — R0789 Other chest pain: Secondary | ICD-10-CM | POA: Diagnosis not present

## 2023-04-16 DIAGNOSIS — E119 Type 2 diabetes mellitus without complications: Secondary | ICD-10-CM | POA: Diagnosis not present

## 2023-04-16 DIAGNOSIS — R0789 Other chest pain: Secondary | ICD-10-CM | POA: Diagnosis not present

## 2023-04-16 DIAGNOSIS — E782 Mixed hyperlipidemia: Secondary | ICD-10-CM | POA: Diagnosis not present

## 2023-04-28 ENCOUNTER — Other Ambulatory Visit: Payer: Self-pay | Admitting: Family Medicine

## 2023-04-28 DIAGNOSIS — F410 Panic disorder [episodic paroxysmal anxiety] without agoraphobia: Secondary | ICD-10-CM

## 2023-05-06 DIAGNOSIS — F410 Panic disorder [episodic paroxysmal anxiety] without agoraphobia: Secondary | ICD-10-CM | POA: Diagnosis not present

## 2023-06-16 ENCOUNTER — Other Ambulatory Visit: Payer: Self-pay | Admitting: Family Medicine

## 2023-06-16 DIAGNOSIS — I10 Essential (primary) hypertension: Secondary | ICD-10-CM

## 2023-07-08 DIAGNOSIS — F411 Generalized anxiety disorder: Secondary | ICD-10-CM | POA: Diagnosis not present

## 2023-07-08 DIAGNOSIS — F410 Panic disorder [episodic paroxysmal anxiety] without agoraphobia: Secondary | ICD-10-CM | POA: Diagnosis not present

## 2023-07-19 ENCOUNTER — Encounter: Payer: Self-pay | Admitting: Family Medicine

## 2023-07-19 ENCOUNTER — Ambulatory Visit: Payer: Self-pay | Admitting: Family Medicine

## 2023-07-19 DIAGNOSIS — E782 Mixed hyperlipidemia: Secondary | ICD-10-CM | POA: Diagnosis not present

## 2023-07-19 DIAGNOSIS — E119 Type 2 diabetes mellitus without complications: Secondary | ICD-10-CM | POA: Diagnosis not present

## 2023-07-19 DIAGNOSIS — Z7984 Long term (current) use of oral hypoglycemic drugs: Secondary | ICD-10-CM

## 2023-07-19 DIAGNOSIS — Z7985 Long-term (current) use of injectable non-insulin antidiabetic drugs: Secondary | ICD-10-CM

## 2023-07-19 DIAGNOSIS — E063 Autoimmune thyroiditis: Secondary | ICD-10-CM

## 2023-07-19 DIAGNOSIS — I10 Essential (primary) hypertension: Secondary | ICD-10-CM

## 2023-07-19 MED ORDER — METFORMIN HCL ER 500 MG PO TB24
ORAL_TABLET | ORAL | 1 refills | Status: AC
Start: 2023-07-19 — End: ?

## 2023-07-19 MED ORDER — LEVOTHYROXINE SODIUM 150 MCG PO TABS: 150.0000 ug | ORAL_TABLET | Freq: Every day | ORAL | 1 refills | Status: AC

## 2023-07-19 MED ORDER — ATORVASTATIN CALCIUM 10 MG PO TABS: 10.0000 mg | ORAL_TABLET | Freq: Every day | ORAL | 1 refills | Status: AC

## 2023-07-19 MED ORDER — LISINOPRIL 5 MG PO TABS: 5.0000 mg | ORAL_TABLET | Freq: Every day | ORAL | 0 refills | Status: AC

## 2023-07-19 MED ORDER — OZEMPIC (1 MG/DOSE) 4 MG/3ML ~~LOC~~ SOPN: 1.0000 mg | PEN_INJECTOR | SUBCUTANEOUS | 3 refills | Status: AC

## 2023-07-19 NOTE — Progress Notes (Signed)
 Date:  07/19/2023   Name:  Teresa Koch   DOB:  1980/10/19   MRN:  [id number]   Chief Complaint: Diabetes (Patient presents today for a follow up on her diabetes. She has been taking her medication as directed and doing well today. )  Diabetes She presents for her follow-up diabetic visit. She has type 2 diabetes mellitus. Her disease course has been stable. Hypoglycemia symptoms include nervousness/anxiousness. Pertinent negatives for hypoglycemia include no headaches, sweats or tremors. There are no diabetic associated symptoms. Pertinent negatives for diabetes include no blurred vision, no chest pain, no fatigue, no foot paresthesias, no foot ulcerations, no polydipsia, no polyphagia, no polyuria, no visual change, no weakness and no weight loss. There are no hypoglycemic complications. Symptoms are stable. There are no diabetic complications. Pertinent negatives for diabetic complications include no CVA. Risk factors for coronary artery disease include dyslipidemia and hypertension. Current diabetic treatment includes oral agent (monotherapy) (nonisulin injectable). Her weight is stable. She is following a generally healthy diet. Meal planning includes avoidance of concentrated sweets and carbohydrate counting. Her home blood glucose trend is fluctuating minimally.  Thyroid  Problem Presents for follow-up visit. Symptoms include anxiety, heat intolerance and weight gain. Patient reports no cold intolerance, constipation, depressed mood, diaphoresis, diarrhea, dry skin, fatigue, hair loss, hoarse voice, leg swelling, menstrual problem, nail problem, palpitations, tremors, visual change or weight loss. The symptoms have been stable. Her past medical history is significant for hyperlipidemia. There is no history of diabetes.  Hyperlipidemia This is a chronic problem. The current episode started more than 1 year ago. The problem is controlled. Recent lipid tests were reviewed and are normal. She has  no history of chronic renal disease, diabetes, hypothyroidism, liver disease, obesity or nephrotic syndrome. Pertinent negatives include no chest pain, focal sensory loss, focal weakness, leg pain, myalgias or shortness of breath. Current antihyperlipidemic treatment includes statins. The current treatment provides moderate improvement of lipids. There are no compliance problems.  Risk factors for coronary artery disease include dyslipidemia.  Hypertension This is a chronic problem. The current episode started more than 1 year ago. The problem has been gradually improving since onset. The problem is controlled. Pertinent negatives include no anxiety, blurred vision, chest pain, headaches, malaise/fatigue, neck pain, orthopnea, palpitations, peripheral edema, PND, shortness of breath or sweats. There are no associated agents to hypertension. Risk factors for coronary artery disease include diabetes mellitus and dyslipidemia. Past treatments include ACE inhibitors. The current treatment provides moderate improvement. There are no compliance problems.  There is no history of CAD/MI or CVA. Identifiable causes of hypertension include a thyroid  problem. There is no history of chronic renal disease, a hypertension causing med or renovascular disease.    Lab Results  Component Value Date   NA 138 03/18/2023   K 4.5 03/18/2023   CO2 20 03/18/2023   GLUCOSE 140 (H) 03/18/2023   BUN 12 03/18/2023   CREATININE 0.89 03/18/2023   CALCIUM  9.4 03/18/2023   EGFR 82 03/18/2023   Lab Results  Component Value Date   CHOL 201 (H) 03/18/2023   HDL 46 03/18/2023   LDLCALC 128 (H) 03/18/2023   TRIG 150 (H) 03/18/2023   Lab Results  Component Value Date   TSH 1.270 03/18/2023   Lab Results  Component Value Date   HGBA1C 7.0 (H) 03/18/2023   No results found for: "WBC", "HGB", "HCT", "MCV", "PLT" Lab Results  Component Value Date   ALT 28 03/18/2023   AST 20 03/18/2023  ALKPHOS 112 03/18/2023   BILITOT  0.4 03/18/2023   No results found for: "25OHVITD2", "25OHVITD3", "VD25OH"   Review of Systems  Constitutional:  Positive for weight gain. Negative for diaphoresis, fatigue, malaise/fatigue and weight loss.  HENT:  Negative for hoarse voice, postnasal drip, sinus pressure and trouble swallowing.   Eyes:  Negative for blurred vision.  Respiratory:  Negative for choking, chest tightness and shortness of breath.   Cardiovascular:  Negative for chest pain, palpitations, orthopnea, leg swelling and PND.  Gastrointestinal:  Negative for abdominal pain, blood in stool, constipation and diarrhea.  Endocrine: Positive for heat intolerance. Negative for cold intolerance, polydipsia, polyphagia and polyuria.  Genitourinary:  Negative for difficulty urinating and menstrual problem.  Musculoskeletal:  Negative for myalgias and neck pain.  Neurological:  Negative for tremors, focal weakness, weakness and headaches.  Psychiatric/Behavioral:  The patient is nervous/anxious.     Patient Active Problem List   Diagnosis Date Noted   Hashimoto's disease 07/03/2021   Primary osteoarthritis of left knee 10/29/2020   Tear of medial meniscus of right knee, current 10/29/2020   Lesion of bone of knee 10/29/2020    Allergies  Allergen Reactions   Oxycodone-Acetaminophen     Other reaction(s): Other Hallucinations and paranoia   Diphenhydramine-Acetaminophen Other (See Comments)    Percocet- Hallucinations   Other     Other reaction(s): Other (See Comments) Percocet- Hallucinations    Penicillins Nausea And Vomiting, Other (See Comments) and Diarrhea    Migraines      Past Surgical History:  Procedure Laterality Date   CESAREAN SECTION     x1   ECTOPIC PREGNANCY SURGERY      Social History   Tobacco Use   Smoking status: Former    Current packs/day: 0.00    Types: Cigarettes    Start date: 03/30/2000    Quit date: 03/31/2015    Years since quitting: 8.3   Smokeless tobacco: Never  Vaping  Use   Vaping status: Never Used  Substance Use Topics   Alcohol use: Yes    Comment: once a week   Drug use: Never     Medication list has been reviewed and updated.  Current Meds  Medication Sig   atorvastatin  (LIPITOR) 10 MG tablet Take 1 tablet (10 mg total) by mouth daily.   clonazePAM (KLONOPIN) 0.5 MG tablet Take 0.5 mg by mouth daily as needed.   escitalopram (LEXAPRO) 10 MG tablet Take 10 mg by mouth every morning.   Insulin Pen Needle (PEN NEEDLES 3/16") 31G X 5 MM MISC 1 each by Does not apply route once a week.   levothyroxine  (SYNTHROID ) 150 MCG tablet Take 1 tablet (150 mcg total) by mouth daily.   lisinopril  (ZESTRIL ) 5 MG tablet TAKE 1 TABLET (5 MG TOTAL) BY MOUTH DAILY.   metFORMIN  (GLUCOPHAGE -XR) 500 MG 24 hr tablet TAKE 1 TABLET BY MOUTH EVERY DAY WITH BREAKFAST   Semaglutide , 1 MG/DOSE, (OZEMPIC , 1 MG/DOSE,) 4 MG/3ML SOPN Inject 1 mg as directed once a week.       03/18/2023    8:29 AM 02/02/2023    8:08 AM 01/07/2023    2:25 PM 10/19/2022    8:39 AM  GAD 7 : Generalized Anxiety Score  Nervous, Anxious, on Edge 3 3 3  0  Control/stop worrying 2 3 3  0  Worry too much - different things 1 1 0 0  Trouble relaxing 1 1 0 0  Restless 0 0 0 0  Easily annoyed or irritable  0 0 0 0  Afraid - awful might happen 2 3 3  0  Total GAD 7 Score 9 11 9  0  Anxiety Difficulty Not difficult at all Not difficult at all Extremely difficult Not difficult at all       03/18/2023    8:29 AM 02/02/2023    8:08 AM 01/07/2023    2:24 PM  Depression screen PHQ 2/9  Decreased Interest 0 0 0  Down, Depressed, Hopeless 0 0 0  PHQ - 2 Score 0 0 0  Altered sleeping 0 0 0  Tired, decreased energy 2 0 0  Change in appetite 0 0 0  Feeling bad or failure about yourself  1 1 0  Trouble concentrating 2 1 1   Moving slowly or fidgety/restless 0 0 0  Suicidal thoughts 0 0 0  PHQ-9 Score 5 2 1   Difficult doing work/chores  Not difficult at all Not difficult at all    BP Readings from  Last 3 Encounters:  07/19/23 120/80  03/18/23 128/78  02/02/23 110/72    Physical Exam Vitals and nursing note reviewed.  Constitutional:      General: She is not in acute distress.    Appearance: She is not diaphoretic.  HENT:     Head: Normocephalic and atraumatic.     Right Ear: Tympanic membrane and external ear normal.     Left Ear: Tympanic membrane and external ear normal.     Nose: Nose normal.     Mouth/Throat:     Mouth: Mucous membranes are moist.  Eyes:     General:        Right eye: No discharge.        Left eye: No discharge.     Conjunctiva/sclera: Conjunctivae normal.     Pupils: Pupils are equal, round, and reactive to light.  Neck:     Thyroid : No thyromegaly.     Vascular: No JVD.  Cardiovascular:     Rate and Rhythm: Normal rate and regular rhythm.     Heart sounds: Normal heart sounds. No murmur heard.    No friction rub. No gallop.  Pulmonary:     Effort: Pulmonary effort is normal.     Breath sounds: Normal breath sounds. No wheezing, rhonchi or rales.  Abdominal:     General: Bowel sounds are normal.     Palpations: Abdomen is soft. There is no mass.     Tenderness: There is no abdominal tenderness. There is no guarding.  Musculoskeletal:        General: Normal range of motion.     Cervical back: Normal range of motion and neck supple.  Lymphadenopathy:     Cervical: No cervical adenopathy.  Skin:    General: Skin is warm and dry.     Capillary Refill: Capillary refill takes less than 2 seconds.  Neurological:     Mental Status: She is alert.     Deep Tendon Reflexes: Reflexes are normal and symmetric.     Wt Readings from Last 3 Encounters:  07/19/23 (!) 341 lb (154.7 kg)  03/18/23 (!) 322 lb 3.2 oz (146.1 kg)  02/02/23 (!) 316 lb (143.3 kg)    BP 120/80   Pulse 87   Ht 5\' 7"  (1.702 m)   Wt (!) 341 lb (154.7 kg)   SpO2 96%   BMI 53.41 kg/m   Assessment and Plan: 1. Mixed hyperlipidemia Chronic..  Stable.  Controlled.   Asymptomatic.  Tolerating well medication.  Patient controlling  LDL with dietary monitoring of triglycerides and fats as well as atorvastatin  10 mg once a day.  Patient is without myalgias or muscle weakness.  Will check renal function panel as well as lipid panel for current level of LDL HDL controlled. - atorvastatin  (LIPITOR) 10 MG tablet; Take 1 tablet (10 mg total) by mouth daily.  Dispense: 90 tablet; Refill: 1 - Renal Function Panel - Lipid Panel With LDL/HDL Ratio  2. Hypothyroidism due to Hashimoto's thyroiditis Chronic.  Controlled.  Stable.  Asymptomatic.  Tolerating current dosing of the levothyroxine  150 mcg daily.  Review of previous TSH is acceptable and we will continue current dosing levothyroxine  150 mcg daily. - levothyroxine  (SYNTHROID ) 150 MCG tablet; Take 1 tablet (150 mcg total) by mouth daily.  Dispense: 90 tablet; Refill: 1  3. Primary hypertension Chronic.  Controlled.  Stable.  Asymptomatic.  Blood pressure today 120/80.  Tolerating current medication well.  Continue lisinopril  5 mg once a day.  Will recheck renal function panel for electrolytes and GFR. - lisinopril  (ZESTRIL ) 5 MG tablet; Take 1 tablet (5 mg total) by mouth daily.  Dispense: 90 tablet; Refill: 0 - Renal Function Panel  4. Diabetes mellitus treated with oral medication (HCC) Chronic.  Controlled.  Stable.  In addition to monitoring glucose and carbohydrate intake patient will continue metformin  XR 500 mg once a day.  Will check A1c for current level of control.  Patient will also continue with 1 mg Ozempic  daily.  Will recheck patient in 4 months. - metFORMIN  (GLUCOPHAGE -XR) 500 MG 24 hr tablet; TAKE 1 TABLET BY MOUTH EVERY DAY WITH BREAKFAST  Dispense: 90 tablet; Refill: 1 - Renal Function Panel - Hemoglobin A1c  5. Long-term current use of injectable noninsulin antidiabetic medication As noted above - Semaglutide , 1 MG/DOSE, (OZEMPIC , 1 MG/DOSE,) 4 MG/3ML SOPN; Inject 1 mg as directed once a week.   Dispense: 3 mL; Refill: 3 - Hemoglobin A1c     Alayne Allis, MD

## 2023-07-19 NOTE — Patient Instructions (Signed)

## 2023-07-20 ENCOUNTER — Other Ambulatory Visit: Payer: Self-pay

## 2023-07-20 DIAGNOSIS — E063 Autoimmune thyroiditis: Secondary | ICD-10-CM

## 2023-07-20 LAB — LIPID PANEL WITH LDL/HDL RATIO
Cholesterol, Total: 200 mg/dL — ABNORMAL HIGH (ref 100–199)
HDL: 51 mg/dL (ref >39)
LDL Chol Calc (NIH): 128 mg/dL — ABNORMAL HIGH (ref 0–99)
LDL/HDL Ratio: 2.5 ratio (ref 0.0–3.2)
Triglycerides: 119 mg/dL (ref 0–149)
VLDL Cholesterol Cal: 21 mg/dL (ref 5–40)

## 2023-07-20 LAB — RENAL FUNCTION PANEL
Albumin: 4.1 g/dL (ref 3.9–4.9)
BUN/Creatinine Ratio: 12 (ref 9–23)
BUN: 10 mg/dL (ref 6–24)
CO2: 20 mmol/L (ref 20–29)
Calcium: 8.6 mg/dL — ABNORMAL LOW (ref 8.7–10.2)
Chloride: 101 mmol/L (ref 96–106)
Creatinine, Ser: 0.85 mg/dL (ref 0.57–1.00)
Glucose: 163 mg/dL — ABNORMAL HIGH (ref 70–99)
Phosphorus: 2.6 mg/dL — ABNORMAL LOW (ref 3.0–4.3)
Potassium: 4.7 mmol/L (ref 3.5–5.2)
Sodium: 138 mmol/L (ref 134–144)
eGFR: 87 mL/min/{1.73_m2} (ref >59)

## 2023-07-20 LAB — HEMOGLOBIN A1C
Est. average glucose Bld gHb Est-mCnc: 148 mg/dL
Hgb A1c MFr Bld: 6.8 % — ABNORMAL HIGH (ref 4.8–5.6)

## 2023-07-27 ENCOUNTER — Other Ambulatory Visit: Payer: Self-pay

## 2023-07-27 DIAGNOSIS — R7989 Other specified abnormal findings of blood chemistry: Secondary | ICD-10-CM

## 2023-10-07 DIAGNOSIS — F411 Generalized anxiety disorder: Secondary | ICD-10-CM | POA: Diagnosis not present

## 2023-10-07 DIAGNOSIS — F410 Panic disorder [episodic paroxysmal anxiety] without agoraphobia: Secondary | ICD-10-CM | POA: Diagnosis not present

## 2023-11-12 ENCOUNTER — Other Ambulatory Visit: Payer: Self-pay | Admitting: Medical Genetics

## 2023-11-18 ENCOUNTER — Encounter: Admitting: Student

## 2023-11-25 DIAGNOSIS — Z1331 Encounter for screening for depression: Secondary | ICD-10-CM | POA: Diagnosis not present

## 2023-11-25 DIAGNOSIS — E119 Type 2 diabetes mellitus without complications: Secondary | ICD-10-CM | POA: Diagnosis not present

## 2023-11-25 DIAGNOSIS — Z0000 Encounter for general adult medical examination without abnormal findings: Secondary | ICD-10-CM | POA: Diagnosis not present

## 2023-11-25 DIAGNOSIS — I10 Essential (primary) hypertension: Secondary | ICD-10-CM | POA: Diagnosis not present

## 2023-11-25 DIAGNOSIS — Z1321 Encounter for screening for nutritional disorder: Secondary | ICD-10-CM | POA: Diagnosis not present

## 2023-11-25 DIAGNOSIS — E063 Autoimmune thyroiditis: Secondary | ICD-10-CM | POA: Diagnosis not present

## 2023-11-25 DIAGNOSIS — Z1339 Encounter for screening examination for other mental health and behavioral disorders: Secondary | ICD-10-CM | POA: Diagnosis not present

## 2023-12-09 IMAGING — MR MR [PERSON_NAME] LOW WO/W CM*L*
4 of 9 series · 15 of 40 positions shown · IV contrast (gadavist)
Comparison: 08/01/2020

CLINICAL DATA: Incidental bone lesion noted on prior MRI
08/01/2020. Patient presents for follow-up evaluation.

EXAM:
MRI OF LOWER LEFT EXTREMITY WITHOUT AND WITH CONTRAST
TECHNIQUE: Multiplanar, multisequence MR imaging of the left lower leg was
performed both before and after administration of intravenous
contrast.
CONTRAST:  10mL GADAVIST GADOBUTROL 1 MMOL/ML IV SOLN

[Series 4: [id number]_ · coronal · left · 4.0mm · 0.42mm/px · 3 of 34 slices shown]
[im 1/34]
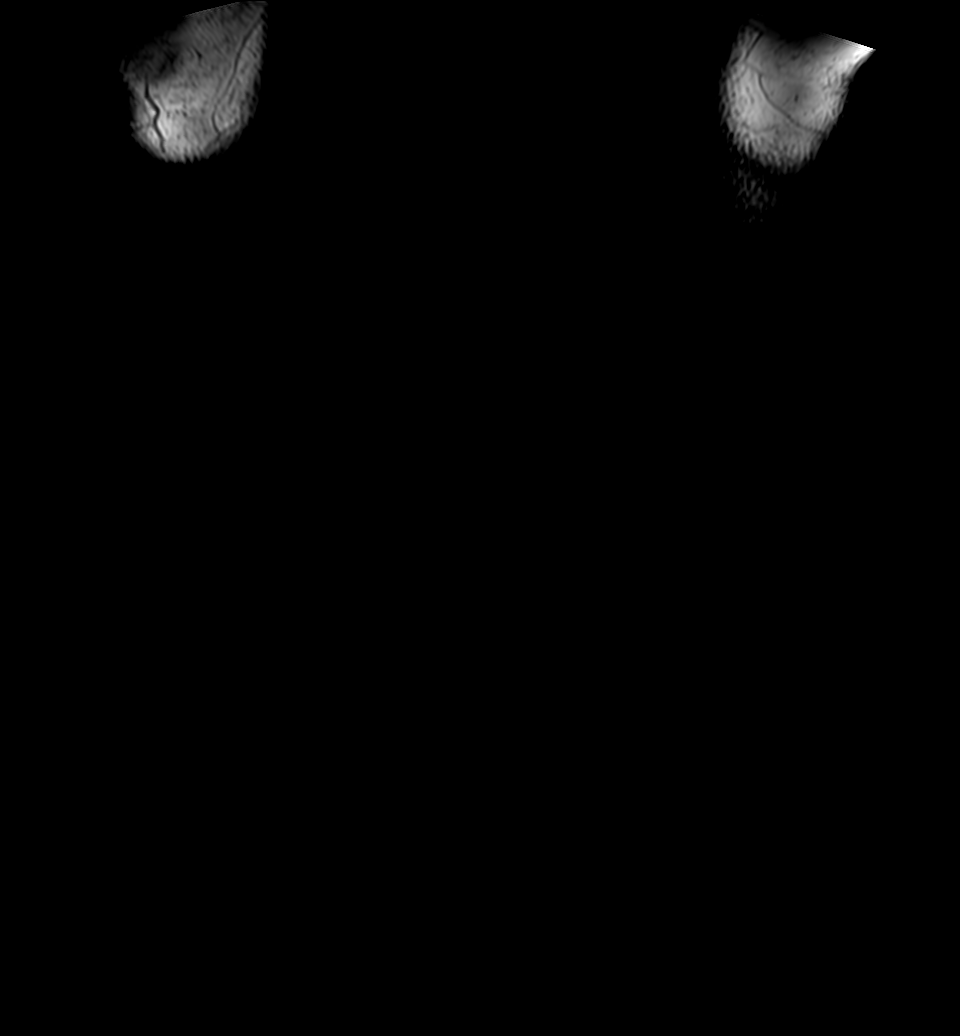
[im 17/34]
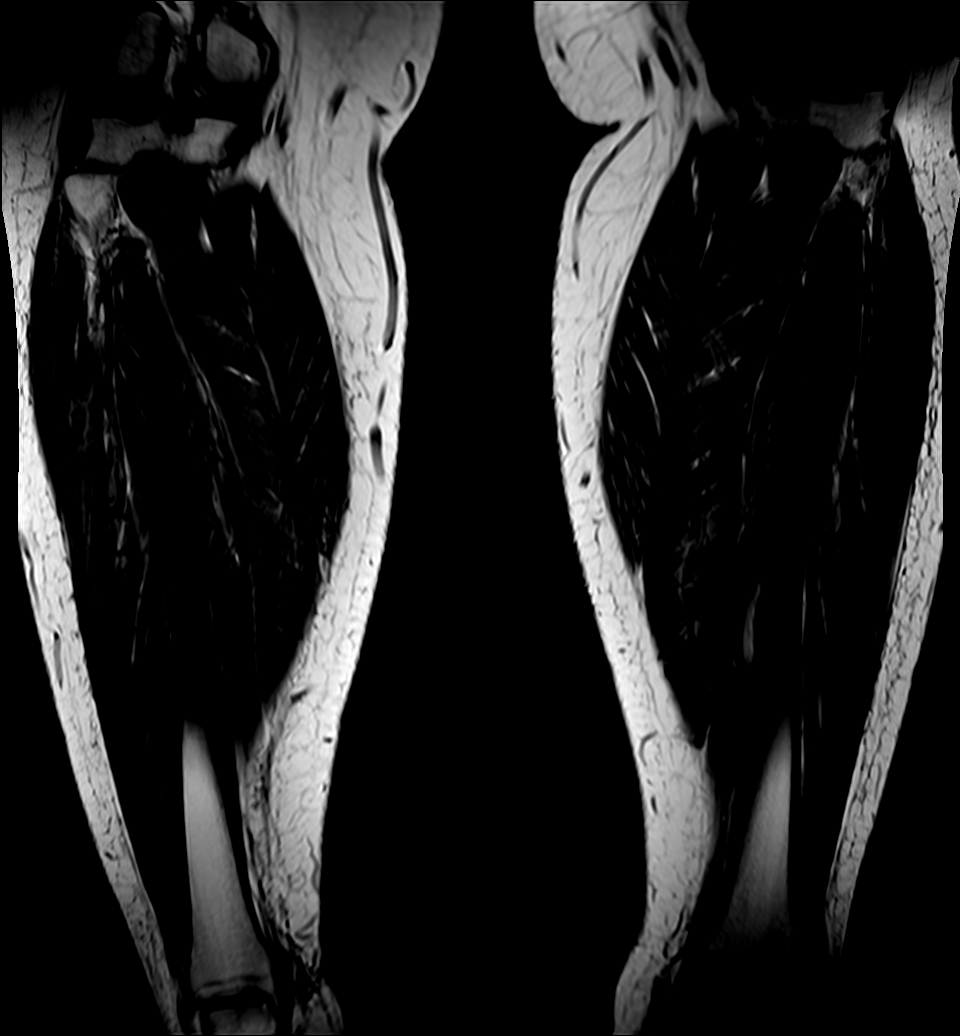
[im 34/34]
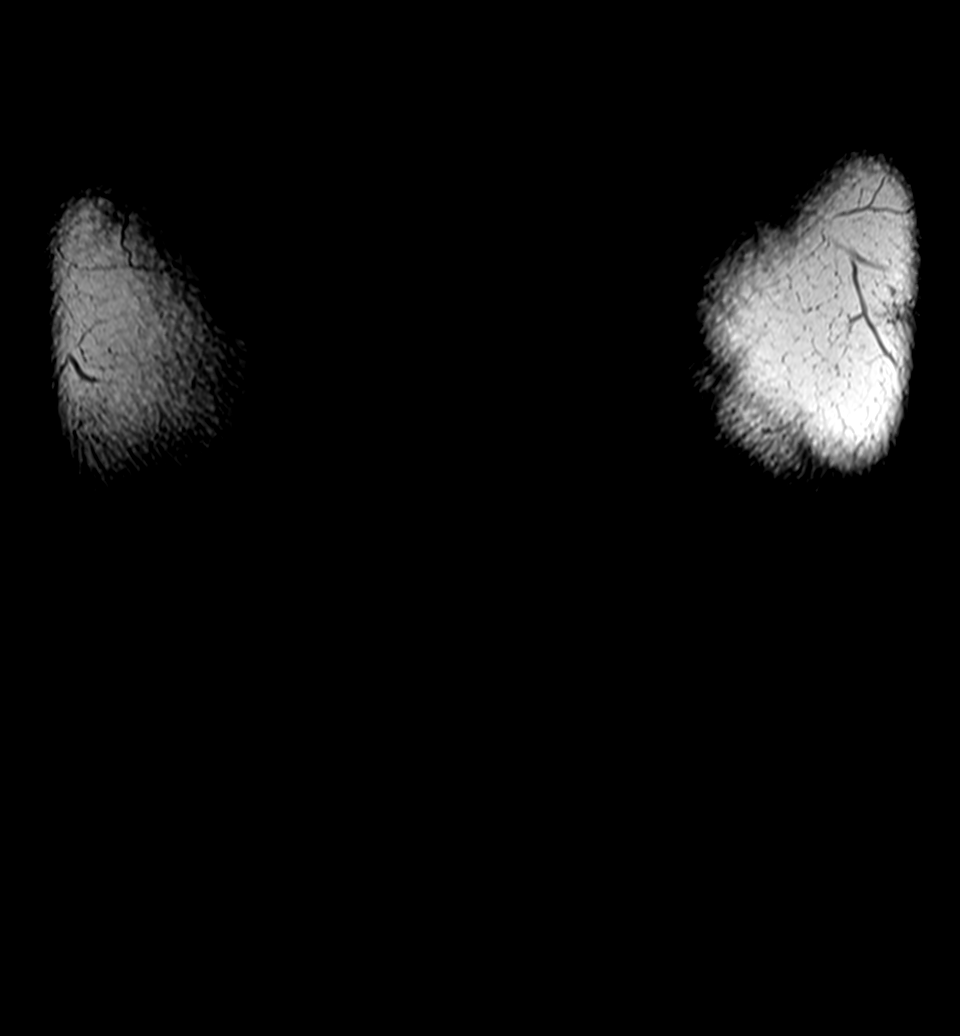

[Series 5: [id number]_ · coronal · left · 4.0mm · 0.55mm/px · 3 of 34 slices shown]
[im 1/34]
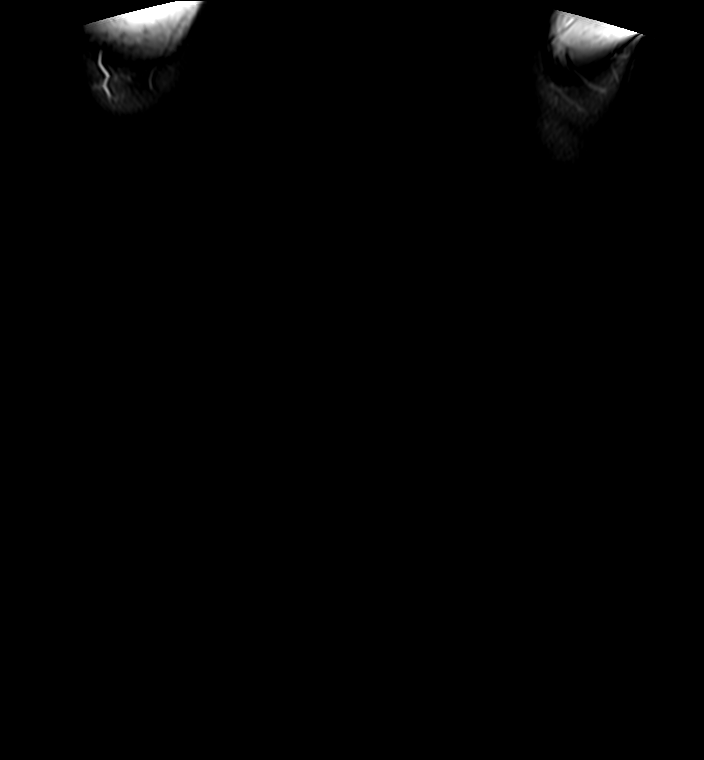
[im 17/34]
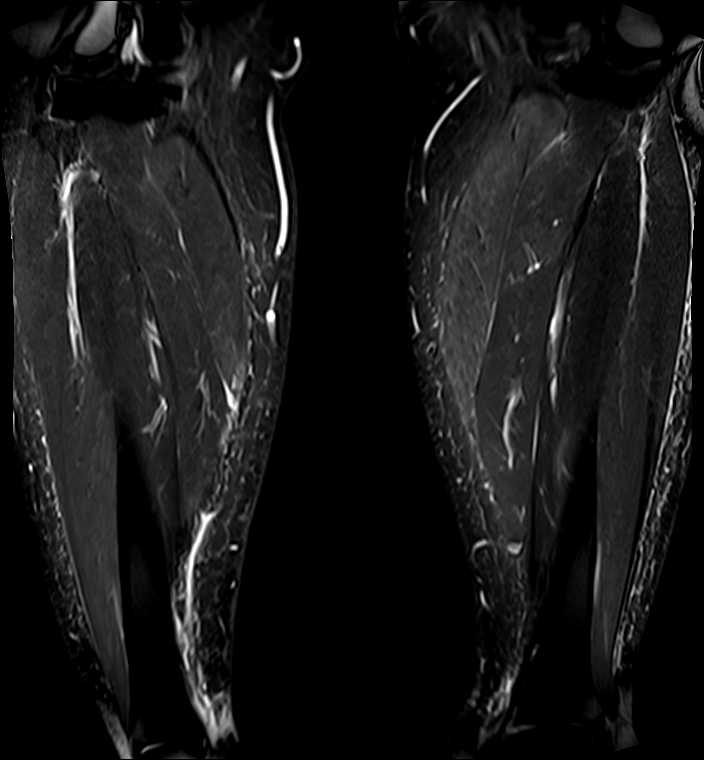
[im 34/34]
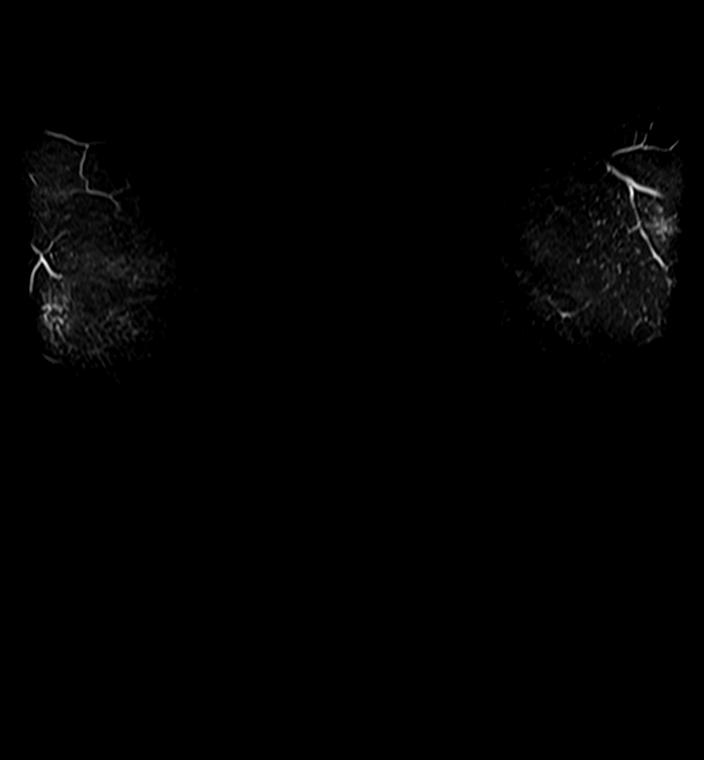

[Series 15: T1 fat-sat · axial · left · 4.0mm · 0.70mm/px · z∈[-213,+144]mm · 6 of 88 slices shown (1 of 2)]
[im 1/88]
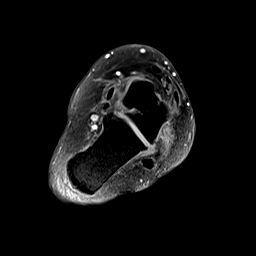
[im 15/88]
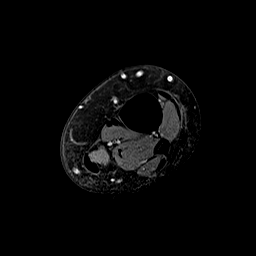
[im 30/88]
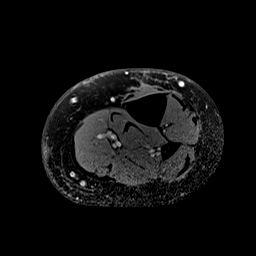
[im 44/88]
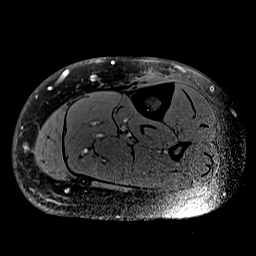
[im 59/88]
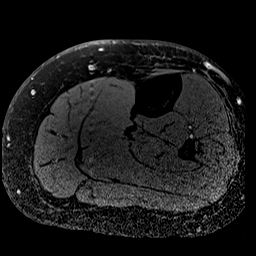
[im 73/88]
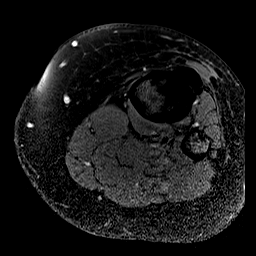

[Series 18: T1 fat-sat · axial · left · 4.0mm · 0.70mm/px · z∈[-144,+144]mm · 3 of 88 slices shown (2 of 2)]
[im 15/88]
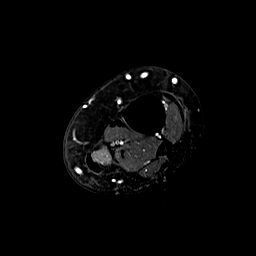
[im 44/88]
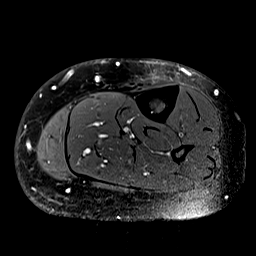
[im 73/88]
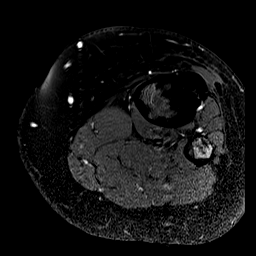

[15 of 40 positions shown; findings below may reference images not displayed]

FINDINGS: Bones/Joint/Cartilage

No fracture or dislocation. Normal alignment. No joint effusion.

Stable well-demarcated 5.8 x 1.5 x 1.7 cm intramedullary chondroid
bone lesion in the proximal fibular metaphysis and proximal
diaphysis with mild stable cortical thinning along the anterior
aspect. No periosteal reaction, bone destruction or associated soft
tissue mass. No surrounding bone marrow edema.

No other bone lesion.  No other areas of marrow abnormality.

Partially visualize is peripheral meniscal extrusion of the medial
meniscus bilaterally.

Ligaments

Collateral ligaments are intact.

Muscles and Tendons

Muscles are normal.

Soft tissue
No fluid collection or hematoma.  No soft tissue mass.
IMPRESSION: 1. Stable well-circumscribed 5.8 x 1.5 x 1.7 cm intramedullary
chondroid bone lesion in the proximal fibular metaphysis and
proximal diaphysis with mild stable cortical thinning along the
anterior aspect. Differential considerations include an enchondroma
versus low-grade chondrosarcoma. Continue surveillance recommended
with MRI in 6 months to monitor for stability.
2. Partially visualize is peripheral meniscal extrusion of the
medial meniscus bilaterally.

## 2023-12-21 ENCOUNTER — Encounter: Admitting: Student

## 2023-12-23 ENCOUNTER — Other Ambulatory Visit

## 2024-01-06 NOTE — Progress Notes (Signed)
 Teresa Koch                                          MRN: [id number]   01/06/2024   The [facility] Quality Team Specialist reviewed this patient medical record for the purposes of chart review for care gap closure. The following were reviewed: abstraction for care gap closure-glycemic status assessment.    [facility] Quality Team

## 2024-01-17 ENCOUNTER — Other Ambulatory Visit: Payer: Self-pay | Admitting: Medical Genetics

## 2024-01-17 DIAGNOSIS — Z006 Encounter for examination for normal comparison and control in clinical research program: Secondary | ICD-10-CM

## 2024-01-24 DIAGNOSIS — R0789 Other chest pain: Secondary | ICD-10-CM | POA: Diagnosis not present

## 2024-01-24 DIAGNOSIS — E782 Mixed hyperlipidemia: Secondary | ICD-10-CM | POA: Diagnosis not present

## 2024-01-24 DIAGNOSIS — E119 Type 2 diabetes mellitus without complications: Secondary | ICD-10-CM | POA: Diagnosis not present

## 2024-01-25 DIAGNOSIS — F411 Generalized anxiety disorder: Secondary | ICD-10-CM | POA: Diagnosis not present

## 2024-01-25 DIAGNOSIS — F410 Panic disorder [episodic paroxysmal anxiety] without agoraphobia: Secondary | ICD-10-CM | POA: Diagnosis not present

## 2024-02-01 DIAGNOSIS — F410 Panic disorder [episodic paroxysmal anxiety] without agoraphobia: Secondary | ICD-10-CM | POA: Diagnosis not present

## 2024-02-01 DIAGNOSIS — F411 Generalized anxiety disorder: Secondary | ICD-10-CM | POA: Diagnosis not present

## 2024-02-09 DIAGNOSIS — F410 Panic disorder [episodic paroxysmal anxiety] without agoraphobia: Secondary | ICD-10-CM | POA: Diagnosis not present

## 2024-02-09 DIAGNOSIS — F411 Generalized anxiety disorder: Secondary | ICD-10-CM | POA: Diagnosis not present

## 2024-02-11 DIAGNOSIS — M898X6 Other specified disorders of bone, lower leg: Secondary | ICD-10-CM | POA: Diagnosis not present

## 2024-02-11 DIAGNOSIS — M899 Disorder of bone, unspecified: Secondary | ICD-10-CM | POA: Diagnosis not present

## 2024-02-16 DIAGNOSIS — F411 Generalized anxiety disorder: Secondary | ICD-10-CM | POA: Diagnosis not present

## 2024-02-16 DIAGNOSIS — F410 Panic disorder [episodic paroxysmal anxiety] without agoraphobia: Secondary | ICD-10-CM | POA: Diagnosis not present

## 2024-02-22 DIAGNOSIS — F410 Panic disorder [episodic paroxysmal anxiety] without agoraphobia: Secondary | ICD-10-CM | POA: Diagnosis not present

## 2024-02-22 DIAGNOSIS — F411 Generalized anxiety disorder: Secondary | ICD-10-CM | POA: Diagnosis not present

## 2024-03-01 DIAGNOSIS — F410 Panic disorder [episodic paroxysmal anxiety] without agoraphobia: Secondary | ICD-10-CM | POA: Diagnosis not present

## 2024-03-01 DIAGNOSIS — F411 Generalized anxiety disorder: Secondary | ICD-10-CM | POA: Diagnosis not present

## 2024-03-08 DIAGNOSIS — F411 Generalized anxiety disorder: Secondary | ICD-10-CM | POA: Diagnosis not present

## 2024-03-08 DIAGNOSIS — F410 Panic disorder [episodic paroxysmal anxiety] without agoraphobia: Secondary | ICD-10-CM | POA: Diagnosis not present

## 2024-03-14 DIAGNOSIS — F410 Panic disorder [episodic paroxysmal anxiety] without agoraphobia: Secondary | ICD-10-CM | POA: Diagnosis not present

## 2024-03-14 DIAGNOSIS — F411 Generalized anxiety disorder: Secondary | ICD-10-CM | POA: Diagnosis not present

## 2024-03-21 DIAGNOSIS — F411 Generalized anxiety disorder: Secondary | ICD-10-CM | POA: Diagnosis not present

## 2024-03-21 DIAGNOSIS — F410 Panic disorder [episodic paroxysmal anxiety] without agoraphobia: Secondary | ICD-10-CM | POA: Diagnosis not present
